# Patient Record
Sex: Male | Born: 1975 | Race: White | Hispanic: No | Marital: Married | Smoking: Never smoker
Health system: Southern US, Community
[De-identification: ages and names within clinical notes are randomized; demographics above are authoritative.]

## PROBLEM LIST (undated history)

## (undated) DIAGNOSIS — E079 Disorder of thyroid, unspecified: Secondary | ICD-10-CM

## (undated) HISTORY — DX: Disorder of thyroid, unspecified: E07.9

---

## 2009-08-05 LAB — BASIC METABOLIC PANEL: Creatinine: 1 mg/dL (ref <=1.3)

## 2011-08-02 LAB — HEPATIC FUNCTION PANEL
ALT: 62 U/L — AB (ref 10–40)
AST: 29 U/L (ref 14–40)

## 2012-08-24 ENCOUNTER — Ambulatory Visit (INDEPENDENT_AMBULATORY_CARE_PROVIDER_SITE_OTHER): Payer: 59 | Admitting: Family Medicine

## 2012-08-24 ENCOUNTER — Encounter: Payer: Self-pay | Admitting: Family Medicine

## 2012-08-24 VITALS — BP 109/72 | HR 85 | Ht 72.0 in | Wt 173.0 lb

## 2012-08-24 DIAGNOSIS — F988 Other specified behavioral and emotional disorders with onset usually occurring in childhood and adolescence: Secondary | ICD-10-CM

## 2012-08-24 DIAGNOSIS — E039 Hypothyroidism, unspecified: Secondary | ICD-10-CM

## 2012-08-24 MED ORDER — AMPHETAMINE-DEXTROAMPHET ER 30 MG PO CP24: 30.0000 mg | ORAL_CAPSULE | ORAL | Status: DC

## 2012-08-24 NOTE — Progress Notes (Signed)
CC: Wayne Newman is a 37 y.o. male is here for Establish Care   Subjective: HPI:  Pleasant 37 year old here to establish care moved from [facility]  Patient reports history of ADD for the majority of his adulthood for the past year he has been on Adderall 20 mg twice a day he does take an occasional break if he is not working or if he has an uneventful weekend he will take a "prescription holiday". He's noticed improvement with the Adderall with former problems of concentration and task completion at work and at home. He does complain that over the past 2 months he's noticed a second dose of the day does not seem as effective as it has been in the past. 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 Denies unintentional weight loss or gain, constipation or diarrhea nor tremor.  Review of Systems - General ROS: negative for - chills, fever, night sweats, weight gain or weight loss Ophthalmic ROS: negative for - decreased vision Psychological ROS: negative for - anxiety or depression ENT ROS: negative for - hearing change, nasal congestion, tinnitus or allergies Hematological and Lymphatic ROS: negative for - bleeding problems, bruising or swollen lymph nodes Breast ROS: negative Respiratory ROS: no cough, shortness of breath, or wheezing Cardiovascular ROS: no chest pain or dyspnea on exertion Gastrointestinal ROS: no abdominal pain, change in bowel habits, or black or bloody stools Genito-Urinary ROS: negative for - genital discharge, genital ulcers, incontinence or abnormal bleeding from genitals Musculoskeletal ROS: negative for - joint pain or muscle pain Neurological ROS:  negative for - headaches or memory loss Dermatological ROS: negative for lumps, mole changes, rash and skin lesion changes  Past Medical History  Diagnosis Date  . Thyroid disease      Family History  Problem Relation Age of Onset  . Alcohol abuse      grandfather  . Heart attack      grandfather  . Stroke      grandfather     History  Substance Use Topics  . Smoking status: Never Smoker   . Smokeless tobacco: Not on file  . Alcohol Use: Yes     Objective: Filed Vitals:   08/24/12 0846  BP: 109/72  Pulse: 85    General: Alert and Oriented, No Acute Distress HEENT: Pupils equal, round, reactive to light. Conjunctivae clear.  Moist mucous membranes pharynx unremarkable Lungs: Clear to auscultation bilaterally, no wheezing/ronchi/rales.  Comfortable work of breathing. Good air movement. Cardiac: Regular rate and rhythm. Normal S1/S2.  No murmurs, rubs, nor gallops.   Abdomen: Flat soft nontender Extremities: No peripheral edema.  Strong peripheral pulses.  Mental Status: No depression, anxiety, nor agitation. Skin: Warm and dry.  Assessment & Plan: Wayne Newman was seen today for establish care.  Diagnoses and associated orders for this visit:  Hypothyroid - TSH  ADD (attention deficit disorder) - amphetamine-dextroamphetamine (ADDERALL XR) 30 MG 24 hr capsule; Take 1 capsule (30 mg total) by mouth every morning.    Hypothyroidism: Clinically controlled he is due for repeat TSH will provide Synthroid refill based on results ADD: Uncontrolled chronic condition, I've encouraged to start an extended release version of Adderall in hopes of benefits lasting longer in the day compared to immediate  release twice a day   Return in about 4 weeks (around 09/21/2012).

## 2012-08-25 ENCOUNTER — Telehealth: Payer: Self-pay | Admitting: Family Medicine

## 2012-08-25 DIAGNOSIS — E039 Hypothyroidism, unspecified: Secondary | ICD-10-CM

## 2012-08-25 MED ORDER — LEVOTHYROXINE SODIUM 125 MCG PO TABS: 125.0000 ug | ORAL_TABLET | Freq: Every day | ORAL | Status: DC

## 2012-08-25 NOTE — Telephone Encounter (Signed)
LMOM for pt to return call for results and instructions. Imre Vecchione, LPN  

## 2012-08-25 NOTE — Telephone Encounter (Signed)
Wayne Newman, Will you please let Wayne Newman know that his thyroid function appears to be slightly underactive therefore I've sent a [facility] [address].  New dose is , we'll need to recheck thyroid function in 3 months

## 2012-08-25 NOTE — Telephone Encounter (Signed)
Pt notified of results

## 2012-08-28 ENCOUNTER — Encounter: Payer: Self-pay | Admitting: Family Medicine

## 2012-08-31 ENCOUNTER — Encounter: Payer: Self-pay | Admitting: *Deleted

## 2012-09-29 ENCOUNTER — Ambulatory Visit (INDEPENDENT_AMBULATORY_CARE_PROVIDER_SITE_OTHER): Payer: 59 | Admitting: Family Medicine

## 2012-09-29 ENCOUNTER — Encounter: Payer: Self-pay | Admitting: Family Medicine

## 2012-09-29 VITALS — BP 109/73 | HR 79 | Wt 173.0 lb

## 2012-09-29 DIAGNOSIS — Z13220 Encounter for screening for lipoid disorders: Secondary | ICD-10-CM

## 2012-09-29 DIAGNOSIS — Z23 Encounter for immunization: Secondary | ICD-10-CM

## 2012-09-29 DIAGNOSIS — F988 Other specified behavioral and emotional disorders with onset usually occurring in childhood and adolescence: Secondary | ICD-10-CM

## 2012-09-29 DIAGNOSIS — E039 Hypothyroidism, unspecified: Secondary | ICD-10-CM

## 2012-09-29 DIAGNOSIS — R7989 Other specified abnormal findings of blood chemistry: Secondary | ICD-10-CM

## 2012-09-29 LAB — LIPID PANEL
Cholesterol: 171 mg/dL (ref 0–200)
HDL: 39 mg/dL — ABNORMAL LOW (ref >39)
Total CHOL/HDL Ratio: 4.4 Ratio
VLDL: 19 mg/dL (ref 0–40)

## 2012-09-29 LAB — COMPLETE METABOLIC PANEL WITH GFR
AST: 34 U/L (ref 0–37)
Alkaline Phosphatase: 61 U/L (ref 39–117)
BUN: 24 mg/dL — ABNORMAL HIGH (ref 6–23)
GFR, Est Non African American: 87 mL/min
Glucose, Bld: 96 mg/dL (ref 70–99)
Potassium: 4.5 mEq/L (ref 3.5–5.3)
Sodium: 139 mEq/L (ref 135–145)
Total Bilirubin: 1.1 mg/dL (ref 0.3–1.2)
Total Protein: 7 g/dL (ref 6.0–8.3)

## 2012-09-29 MED ORDER — AMPHETAMINE-DEXTROAMPHET ER 20 MG PO CP24: 40.0000 mg | ORAL_CAPSULE | ORAL | Status: DC

## 2012-09-29 NOTE — Progress Notes (Signed)
CC: Wayne Newman is a 37 y.o. male is here for f/u ADHD and Labs Only   Subjective: HPI:  Followup ADD: Patient was switched from Adderall 20 mg twice a day now on 30 mg XR daily he does not notice much of an improvement with concentration and task completion, he thinks that this new regimen is worse than what he was on a month ago. Denies anxiety, depression, mental disturbance, sleep disturbance, appetite suppression, chest pain or irregular heartbeat  History of elevated LFTs one year ago he has not had this checked since then denies right upper quadrant pain, skin or scleral discoloration, fevers, chills, heavy alcohol use, nor abdominal pain.  It is been well over a year since he had cholesterol and fasting blood sugar checked  Review Of Systems Outlined In HPI  Past Medical History  Diagnosis Date  . Thyroid disease      Family History  Problem Relation Age of Onset  . Alcohol abuse      grandfather  . Heart attack      grandfather  . Stroke      grandfather     History  Substance Use Topics  . Smoking status: Never Smoker   . Smokeless tobacco: Not on file  . Alcohol Use: Yes     Objective: Filed Vitals:   09/29/12 0823  BP: 109/73  Pulse: 79    General: Alert and Oriented, No Acute Distress HEENT: Pupils equal, round, reactive to light. Conjunctivae clear.  Moist mucous membranes pharynx unremarkable Lungs: Clear to auscultation bilaterally, no wheezing/ronchi/rales.  Comfortable work of breathing. Good air movement. Cardiac: Regular rate and rhythm. Normal S1/S2.  No murmurs, rubs, nor gallops.   Abdomen: Soft nontender Extremities: No peripheral edema.  Strong peripheral pulses.  Mental Status: No depression, anxiety, nor agitation. Skin: Warm and dry.  Assessment & Plan: Wayne Newman was seen today for f/u adhd and labs only.  Diagnoses and associated orders for this visit:  Elevated LFTs - COMPLETE METABOLIC PANEL WITH GFR  Lipid screening - Lipid  panel  ADD (attention deficit disorder) - amphetamine-dextroamphetamine (ADDERALL XR) 20 MG 24 hr capsule; Take 2 capsules (40 mg total) by mouth every morning.  Hypothyroid  Need for prophylactic vaccination and inoculation against influenza - Flu Vaccine QUAD 36+ mos IM    Elevated LFTs: Recheck today Due for lipid screening he is fasting today ADD: Chronic uncontrolled condition increasing Adderall XR, followup 4 weeks by phone if improving return to clinic if still uncontrolled Hypothyroidism: Currently stable we'll recheck in 2 months  Return in about 4 weeks (around 10/27/2012).

## 2012-10-02 ENCOUNTER — Telehealth: Payer: Self-pay | Admitting: Family Medicine

## 2012-10-02 DIAGNOSIS — R7989 Other specified abnormal findings of blood chemistry: Secondary | ICD-10-CM

## 2012-10-02 NOTE — Telephone Encounter (Signed)
Left message on vm with results and to call back if he has not heard back in about a week for liver u/s

## 2012-10-02 NOTE — Telephone Encounter (Signed)
Sue Lush, Will you please let Wayne Newman know that his LDL cholesterol is technically elevated at 113 but well below his personal goal of less than 160 since he has no cardiac risk factors. No need to consider cholesterol lowering medications at this time.  His liver enzyme remains elevated and I'll recommend that he have an ultrasound of his liver for further evaluation, I've placed an order for this and he should be contacted about scheduling this.  Fasting blood sugar and kidney function were perfect.

## 2012-10-17 ENCOUNTER — Telehealth: Payer: Self-pay | Admitting: *Deleted

## 2012-10-17 DIAGNOSIS — E039 Hypothyroidism, unspecified: Secondary | ICD-10-CM

## 2012-10-17 MED ORDER — AMPHETAMINE-DEXTROAMPHETAMINE 20 MG PO TABS: 20.0000 mg | ORAL_TABLET | Freq: Two times a day (BID) | ORAL | Status: DC

## 2012-10-17 MED ORDER — LEVOTHYROXINE SODIUM 125 MCG PO TABS: 125.0000 ug | ORAL_TABLET | Freq: Every day | ORAL | Status: DC

## 2012-10-17 NOTE — Telephone Encounter (Signed)
Taking synthroid twice a day is something I've never seen before but if that's been his old regimen we can certainly continue it, since his TSH was elevated in the summer I'd still encourage using the tablet but now taken twice a day. This and adderall rx in the inbox.  Sorry, I must have missed this twice a day dosing when looking at his old records.

## 2012-10-17 NOTE — Telephone Encounter (Addendum)
Pt called and states he was wanting to make sure of his Synthroid dose. He states he was taking Synthroid 112 BID. The new medication sent is 125 mcg qd. Also pt states the adderall XR is too expensive and he didn't get filled. He wants to go back to the reg adderall

## 2012-10-18 ENCOUNTER — Telehealth: Payer: Self-pay | Admitting: *Deleted

## 2012-10-18 NOTE — Telephone Encounter (Signed)
Pt.notified

## 2012-10-18 NOTE — Telephone Encounter (Signed)
Pt has been notified.

## 2012-10-18 NOTE — Telephone Encounter (Signed)
Since his thyroid function was underactive when we checked it in August I'd be interested to see how his concentration responds to the increased synthroid dose twice a day along with the former adderall 20mg  twice a day dose for a month before increasing the adderall twice a day dosing.

## 2012-10-18 NOTE — Telephone Encounter (Signed)
Pt states he thought that you were going to increase the dose. I told him you wrote the rx for 20 mg BID and he wanted to know if that dose was comparable to the XR form that he was taking.Wayne Newman.(Perhaps I am missing something because the last time the XR version was rx'ed it was for 30 mg.) I didn't see anything that states you were increasing the dose...please advise

## 2012-11-28 ENCOUNTER — Other Ambulatory Visit: Payer: Self-pay | Admitting: *Deleted

## 2012-11-28 DIAGNOSIS — E039 Hypothyroidism, unspecified: Secondary | ICD-10-CM

## 2012-11-28 MED ORDER — LEVOTHYROXINE SODIUM 125 MCG PO TABS: 125.0000 ug | ORAL_TABLET | Freq: Two times a day (BID) | ORAL | Status: DC

## 2012-12-05 ENCOUNTER — Ambulatory Visit: Payer: 59 | Admitting: Family Medicine

## 2012-12-06 ENCOUNTER — Other Ambulatory Visit: Payer: Self-pay | Admitting: *Deleted

## 2012-12-06 MED ORDER — AMPHETAMINE-DEXTROAMPHETAMINE 20 MG PO TABS: 20.0000 mg | ORAL_TABLET | Freq: Two times a day (BID) | ORAL | Status: DC

## 2012-12-19 ENCOUNTER — Ambulatory Visit (INDEPENDENT_AMBULATORY_CARE_PROVIDER_SITE_OTHER): Payer: 59 | Admitting: Family Medicine

## 2012-12-19 ENCOUNTER — Encounter: Payer: Self-pay | Admitting: Family Medicine

## 2012-12-19 VITALS — BP 104/70 | HR 82 | Wt 178.0 lb

## 2012-12-19 DIAGNOSIS — F988 Other specified behavioral and emotional disorders with onset usually occurring in childhood and adolescence: Secondary | ICD-10-CM

## 2012-12-19 DIAGNOSIS — E039 Hypothyroidism, unspecified: Secondary | ICD-10-CM

## 2012-12-19 DIAGNOSIS — R7989 Other specified abnormal findings of blood chemistry: Secondary | ICD-10-CM

## 2012-12-19 DIAGNOSIS — R5383 Other fatigue: Secondary | ICD-10-CM

## 2012-12-19 DIAGNOSIS — R5381 Other malaise: Secondary | ICD-10-CM

## 2012-12-19 LAB — COMPLETE METABOLIC PANEL WITH GFR
Albumin: 4.4 g/dL (ref 3.5–5.2)
BUN: 13 mg/dL (ref 6–23)
CO2: 31 mEq/L (ref 19–32)
Calcium: 9.9 mg/dL (ref 8.4–10.5)
Chloride: 104 mEq/L (ref 96–112)
GFR, Est African American: >89 mL/min
GFR, Est Non African American: >89 mL/min
Glucose, Bld: 79 mg/dL (ref 70–99)
Potassium: 4.4 mEq/L (ref 3.5–5.3)
Sodium: 141 mEq/L (ref 135–145)
Total Protein: 6.8 g/dL (ref 6.0–8.3)

## 2012-12-19 MED ORDER — AMPHETAMINE-DEXTROAMPHETAMINE 20 MG PO TABS: 30.0000 mg | ORAL_TABLET | Freq: Two times a day (BID) | ORAL | Status: DC

## 2012-12-19 NOTE — Progress Notes (Signed)
CC: Wayne Newman is a 37 y.o. male is here for Follow-up   Subjective: HPI:  Followup hypothyroidism: Over the past 3 months has been taken 125 mcg Synthroid on a daily basis without missed doses. Denies depression, mental disturbance, constipation skin changes but does admit to considerable fatigue of moderate severity that fluctuates from mild to moderate on a daily basis without any precipitating factor that he can find. Describes it as frequent desire to take a nap during the day, nonrestorative sleep, and overall just lack of energy.  This is been going on at least for one month on a daily basis. He present any time of day. Denies falling asleep behind the wheel. Wife says he occasionally snores but this is not consistent.  Followup ADD: Has been taking Adderall 20 mg twice a day for the past 3 months. Despite trying to optimize his thyroid supplementation he reports his to his difficulty at work and at home with task completion and concentration. Reports this is interfering his life to mild to moderate degree a daily basis.  His history of elevated LFTs with rare alcohol use and no other hepatotoxic medications. We ordered a hepatic ultrasound back in September but he believes he never got a voice message about calling back to schedule. Denies skin or scleral discoloration nor right upper quadrant pain  Review Of Systems Outlined In HPI  Past Medical History  Diagnosis Date  . Thyroid disease      Family History  Problem Relation Age of Onset  . Alcohol abuse      grandfather  . Heart attack      grandfather  . Stroke      grandfather     History  Substance Use Topics  . Smoking status: Never Smoker   . Smokeless tobacco: Not on file  . Alcohol Use: Yes     Objective: Filed Vitals:   12/19/12 0816  BP: 104/70  Pulse: 82    General: Alert and Oriented, No Acute Distress HEENT: Pupils equal, round, reactive to light. Conjunctivae clear.  Moist mucous membranes pharynx  unremarkable Lungs: Clear to auscultation bilaterally, no wheezing/ronchi/rales.  Comfortable work of breathing. Good air movement. Cardiac: Regular rate and rhythm. Normal S1/S2.  No murmurs, rubs, nor gallops.   Extremities: No peripheral edema.  Strong peripheral pulses.  Mental Status: No depression, anxiety, nor agitation. Skin: Warm and dry.  Assessment & Plan: Wayne Newman was seen today for follow-up.  Diagnoses and associated orders for this visit:  Hypothyroid - TSH  Elevated LFTs - COMPLETE METABOLIC PANEL WITH GFR  ADD (attention deficit disorder) - amphetamine-dextroamphetamine (ADDERALL) 20 MG tablet; Take 1.5 tablets (30 mg total) by mouth 2 (two) times daily.  Fatigue - COMPLETE METABOLIC PANEL WITH GFR - Vit D  25 hydroxy (rtn osteoporosis monitoring) - B12    Hypothyroidism: Clinically uncontrolled rechecking TSH today Elevated LFTs repeat metabolic panel for LFTs before resubmitting request for abdominal ultrasound ADD: Uncontrolled increasing Adderall Fatigue: Metabolic panel with vitamin studies above, could consider sleep test if all labs above are unremarkable  Return if symptoms worsen or fail to improve.

## 2012-12-20 ENCOUNTER — Encounter: Payer: Self-pay | Admitting: Family Medicine

## 2012-12-20 DIAGNOSIS — R5383 Other fatigue: Secondary | ICD-10-CM | POA: Insufficient documentation

## 2012-12-20 LAB — VITAMIN D 25 HYDROXY (VIT D DEFICIENCY, FRACTURES): Vit D, 25-Hydroxy: 33 ng/mL (ref 30–89)

## 2013-02-15 ENCOUNTER — Other Ambulatory Visit: Payer: Self-pay | Admitting: *Deleted

## 2013-02-15 DIAGNOSIS — F988 Other specified behavioral and emotional disorders with onset usually occurring in childhood and adolescence: Secondary | ICD-10-CM

## 2013-02-15 MED ORDER — AMPHETAMINE-DEXTROAMPHETAMINE 20 MG PO TABS: 30.0000 mg | ORAL_TABLET | Freq: Two times a day (BID) | ORAL | Status: DC

## 2013-02-20 ENCOUNTER — Telehealth: Payer: Self-pay | Admitting: Family Medicine

## 2013-02-20 DIAGNOSIS — G4730 Sleep apnea, unspecified: Secondary | ICD-10-CM | POA: Insufficient documentation

## 2013-02-20 MED ORDER — AMBULATORY NON FORMULARY MEDICATION: Status: DC

## 2013-02-20 NOTE — Telephone Encounter (Signed)
lvm informing pt of results/recommendations.Loralee PacasBarkley, Trasean Delima ViennaLynetta

## 2013-02-20 NOTE — Telephone Encounter (Signed)
Faxed order,sleep study,insurance card,demographics to [facility] at [phone]

## 2013-02-20 NOTE — Telephone Encounter (Signed)
Pt does not have out of network benefits and [facility] is out of network per rep at [facility]. Will try a home health agency

## 2013-02-20 NOTE — Telephone Encounter (Signed)
Sue LushAndrea, Will you please let mr. Raquel Jamesittman know that his sleep study revealed mild sleep apea.  This can certainly contribute to fatigue.  I would recommend he try using CPAP for a few weeks.  Usually this can be arranged through his insurance.  I'll print off an order for this if he's interested, can you please see if [facility] can help.  I'll put the [employer] report in your box in case [facility] needs a copy, can you please scan it once you're done.

## 2013-02-21 NOTE — Telephone Encounter (Signed)
Aram BeechamCynthia from [facility] called and said they have received the order and now they need the progress notes. Progress notes faxed to [phone]

## 2013-04-04 ENCOUNTER — Telehealth: Payer: Self-pay | Admitting: *Deleted

## 2013-04-04 DIAGNOSIS — F988 Other specified behavioral and emotional disorders with onset usually occurring in childhood and adolescence: Secondary | ICD-10-CM

## 2013-04-04 MED ORDER — AMPHETAMINE-DEXTROAMPHETAMINE 20 MG PO TABS: 30.0000 mg | ORAL_TABLET | Freq: Two times a day (BID) | ORAL | Status: DC

## 2013-04-04 NOTE — Telephone Encounter (Signed)
Pt calls and request a refill on his Adderall 20mg  and wants a 90 day supply.  Are you ok with this?

## 2013-04-04 NOTE — Telephone Encounter (Signed)
Wayne Newman (and Manuella GhaziKim FYI), Will you please let patient know that since this is a controlled substance I only write it one month at a time.  He's due for follow up and if we're not needing to change the dose at that visit I can provide post-dated Rxs to last three months.  1 month Rx printed and placed in Andrea's inbox.

## 2013-04-04 NOTE — Telephone Encounter (Signed)
Pt notified to pick up rx. Barry DienesKimberly Gordon, LPN

## 2013-04-04 NOTE — Telephone Encounter (Signed)
rx up front 

## 2013-05-18 ENCOUNTER — Ambulatory Visit (INDEPENDENT_AMBULATORY_CARE_PROVIDER_SITE_OTHER): Payer: 59 | Admitting: Family Medicine

## 2013-05-18 ENCOUNTER — Encounter: Payer: Self-pay | Admitting: Family Medicine

## 2013-05-18 ENCOUNTER — Telehealth: Payer: Self-pay | Admitting: Family Medicine

## 2013-05-18 VITALS — BP 104/63 | HR 90 | Ht 72.0 in | Wt 169.0 lb

## 2013-05-18 DIAGNOSIS — F988 Other specified behavioral and emotional disorders with onset usually occurring in childhood and adolescence: Secondary | ICD-10-CM

## 2013-05-18 DIAGNOSIS — G4730 Sleep apnea, unspecified: Secondary | ICD-10-CM

## 2013-05-18 DIAGNOSIS — E039 Hypothyroidism, unspecified: Secondary | ICD-10-CM

## 2013-05-18 LAB — TSH: TSH: 0.206 u[IU]/mL — ABNORMAL LOW (ref 0.350–4.500)

## 2013-05-18 MED ORDER — AMPHETAMINE-DEXTROAMPHETAMINE 20 MG PO TABS
30.0000 mg | ORAL_TABLET | Freq: Two times a day (BID) | ORAL | Status: DC
Start: 2013-05-18 — End: 2013-10-25

## 2013-05-18 MED ORDER — AMPHETAMINE-DEXTROAMPHETAMINE 20 MG PO TABS: 30.0000 mg | ORAL_TABLET | Freq: Two times a day (BID) | ORAL | Status: DC

## 2013-05-18 NOTE — Progress Notes (Signed)
CC: Wayne BoastRyan Newman is a 38 y.o. male is here for Follow-up   Subjective: HPI:  Followup hypothyroidism:  Continues on Synthroid 250 mcg every evening. Denies missed doses. Review of systems positive for fatigue. Denies unintentional weight loss or gain, anxiety, depression, mental disturbance. Denies skin or hair changes.  Followup ADD: States that attention and concentration remains satisfactory on 30 mg of Adderall twice a day. Denies and side effects. His appetite suppression or sleep disturbance.  States the fatigue has not changed since I saw him last. It is present on a daily basis mild/moderate in severity present all hours of the day. He does have a history of falling asleep behind the wheel but not since I saw him last.  There's been no shortness of breath or weakness   Review Of Systems Outlined In HPI  Past Medical History  Diagnosis Date  . Thyroid disease     No past surgical history on file. Family History  Problem Relation Age of Onset  . Alcohol abuse      grandfather  . Heart attack      grandfather  . Stroke      grandfather    History   Social History  . Marital Status: Married    Spouse Name: N/A    Number of Children: N/A  . Years of Education: N/A   Occupational History  . Not on file.   Social History Main Topics  . Smoking status: Never Smoker   . Smokeless tobacco: Not on file  . Alcohol Use: Yes  . Drug Use: No  . Sexual Activity: Yes    Birth Control/ Protection: Condom   Other Topics Concern  . Not on file   Social History Narrative  . No narrative on file     Objective: BP 104/63  Pulse 90  Ht 6' (1.829 m)  Wt 169 lb (76.658 kg)  BMI 22.92 kg/m2  General: Alert and Oriented, No Acute Distress HEENT: Pupils equal, round, reactive to light. Conjunctivae clear.  Moist mucous membranes pharynx unremarkable. Neck supple without palpable thyromegaly Lungs: Clear to auscultation bilaterally, no wheezing/ronchi/rales.  Comfortable  work of breathing. Good air movement. Cardiac: Regular rate and rhythm. Normal S1/S2.  No murmurs, rubs, nor gallops.   Extremities: No peripheral edema.  Strong peripheral pulses.  Mental Status: No depression, anxiety, nor agitation. Skin: Warm and dry.  Assessment & Plan: Wayne Newman was seen today for follow-up.  Diagnoses and associated orders for this visit:  ADD (attention deficit disorder) - Discontinue: amphetamine-dextroamphetamine (ADDERALL) 20 MG tablet; Take 1.5 tablets (30 mg total) by mouth 2 (two) times daily. - Discontinue: amphetamine-dextroamphetamine (ADDERALL) 20 MG tablet; Take 1.5 tablets (30 mg total) by mouth 2 (two) times daily. May fill on/after 06/18/13 - amphetamine-dextroamphetamine (ADDERALL) 20 MG tablet; Take 1.5 tablets (30 mg total) by mouth 2 (two) times daily. May fill on/after 07/18/13  Hypothyroid - TSH  Mild sleep apnea    ADD: Controlled continue Adderall Hypothyroidism: Suspicion that this could be uncontrolled due to fatigue therefore checking TSH Sleep apnea with fatigue: Clinically uncontrolled, he was never contacted by a respiratory company for CPAP machine per his report therefore we will try to coordinate this again. Urged to try CPAP   Return in about 3 months (around 08/18/2013).

## 2013-05-18 NOTE — Telephone Encounter (Signed)
Wayne Newman, Can you please see if [facility] can help arrange a CPAP for Wayne RossettiRyan, he states he never got called.  Please see phone note from 2/10

## 2013-05-21 ENCOUNTER — Telehealth: Payer: Self-pay | Admitting: Family Medicine

## 2013-05-21 DIAGNOSIS — E039 Hypothyroidism, unspecified: Secondary | ICD-10-CM

## 2013-05-21 MED ORDER — LEVOTHYROXINE SODIUM 112 MCG PO TABS: 224.0000 ug | ORAL_TABLET | Freq: Every day | ORAL | Status: DC

## 2013-05-21 NOTE — Telephone Encounter (Signed)
Pt.notified

## 2013-05-21 NOTE — Telephone Encounter (Signed)
Wayne Newman, Will you please let patient know that his TSH was 0.2 reflecting that his synthroid dose is too high.  I've [facility] in a [facility], I'd recommend he return for a TSH recheck in August.

## 2013-05-24 ENCOUNTER — Other Ambulatory Visit: Payer: Self-pay | Admitting: *Deleted

## 2013-05-24 MED ORDER — AMBULATORY NON FORMULARY MEDICATION: Status: DC

## 2013-05-24 NOTE — Telephone Encounter (Signed)
refaxed every thing back to [facility] with a note that pt didn't hear back from anyone

## 2013-06-10 ENCOUNTER — Other Ambulatory Visit: Payer: Self-pay | Admitting: Family Medicine

## 2013-08-31 ENCOUNTER — Encounter: Payer: Self-pay | Admitting: Family Medicine

## 2013-08-31 ENCOUNTER — Ambulatory Visit (INDEPENDENT_AMBULATORY_CARE_PROVIDER_SITE_OTHER): Payer: Managed Care, Other (non HMO) | Admitting: Family Medicine

## 2013-08-31 VITALS — Ht 72.0 in | Wt 177.0 lb

## 2013-08-31 DIAGNOSIS — L301 Dyshidrosis [pompholyx]: Secondary | ICD-10-CM

## 2013-08-31 DIAGNOSIS — L237 Allergic contact dermatitis due to plants, except food: Secondary | ICD-10-CM

## 2013-08-31 DIAGNOSIS — L255 Unspecified contact dermatitis due to plants, except food: Secondary | ICD-10-CM

## 2013-08-31 DIAGNOSIS — F988 Other specified behavioral and emotional disorders with onset usually occurring in childhood and adolescence: Secondary | ICD-10-CM

## 2013-08-31 MED ORDER — TRIAMCINOLONE ACETONIDE 0.1 % EX CREA
TOPICAL_CREAM | CUTANEOUS | Status: DC
Start: 2013-08-31 — End: 2014-01-08

## 2013-08-31 MED ORDER — PREDNISONE 20 MG PO TABS: ORAL_TABLET | ORAL | Status: AC

## 2013-08-31 NOTE — Progress Notes (Signed)
CC: Wayne Newman is a 38 y.o. male is here for Rash   Subjective: HPI:  Complains of a rash localized on the forearms and back that has been present for the past 3-4 days seems to be spreading, intensely itchy, painless, occurred a few days after known exposure to what he believes was poison ivy. He's had this once before as a child but nothing recently. No interventions as of yet other than washing his body with soap. Denies ocular complaints, shortness of breath, fevers, chills, swollen lymph nodes.  He also points out a rash on the dorsum of his feet have been present for the past months. It usually only occurs in the summer. In the winter will go away without any particular intervention. It is moderately itchy he's been told is only dry skin and was told to just keep the area moist which doesn't seem to help much.  Followup ADD: Has a Adderall prescription that was dated for July that he did not need to fill because he's only taking the medication on days that he has to work and he said a lot of time off this summer. He states that the medication is still helping with concentration, and difficulty with multitasking and ignoring distractions. Denies known intolerance. He wants to know if it's okay to have the prescription filled he been no is a month behind.   Review Of Systems Outlined In HPI  Past Medical History  Diagnosis Date  . Thyroid disease     No past surgical history on file. Family History  Problem Relation Age of Onset  . Alcohol abuse      grandfather  . Heart attack      grandfather  . Stroke      grandfather    History   Social History  . Marital Status: Married    Spouse Name: N/A    Number of Children: N/A  . Years of Education: N/A   Occupational History  . Not on file.   Social History Main Topics  . Smoking status: Never Smoker   . Smokeless tobacco: Not on file  . Alcohol Use: Yes  . Drug Use: No  . Sexual Activity: Yes    Birth Control/  Protection: Condom   Other Topics Concern  . Not on file   Social History Narrative  . No narrative on file     Objective: Ht 6' (1.829 m)  Wt 177 lb (80.287 kg)  BMI 24.00 kg/m2  General: Alert and Oriented, No Acute Distress HEENT: Pupils equal, round, reactive to light. Conjunctivae clear.  Moist mucous membranes there Lungs: Clear to auscultation bilaterally, no wheezing/ronchi/rales.  Comfortable work of breathing. Good air movement. Cardiac: Regular rate and rhythm. Normal S1/S2.  No murmurs, rubs, nor gallops.   Extremities: No peripheral edema.  Strong peripheral pulses.  Mental Status: No depression, anxiety, nor agitation. Skin: Warm and dry. Streaks of fluid filled vesicles on the forearms, lower back which are nontender with mild erythema. Eczematous changes on the dorsum of his left foot greater than right.  Assessment & Plan: Zelig was seen today for rash.  Diagnoses and associated orders for this visit:  Poison ivy dermatitis - predniSONE (DELTASONE) 20 MG tablet; Three tabs daily days 1-3, two tabs daily days 4-6, one tab daily days 7-9, half tab daily days 10-13. - triamcinolone cream (KENALOG) 0.1 %; Apply to affected areas twice a day for up to two weeks, avoid face.  Dyshidrotic eczema - predniSONE (DELTASONE) 20  MG tablet; Three tabs daily days 1-3, two tabs daily days 4-6, one tab daily days 7-9, half tab daily days 10-13. - triamcinolone cream (KENALOG) 0.1 %; Apply to affected areas twice a day for up to two weeks, avoid face.  ADD (attention deficit disorder)    Poison ivy dermatitis: Start prednisone taper and triamcinolone cream Dyshidrotic eczema: Prednisone and triamcinolone cream Will also greatly help with this, I think urgent use triamcinolone cream as needed in the future but to use Eucerin on the feet to help prevent recurrence ADD: Controlled discussed I think there should be no problem at the pharmacy if he tries to fill the prescription  dated for July, call when refills are needed no need to followup until November or December   Return in about 3 months (around 12/01/2013).

## 2013-10-25 ENCOUNTER — Other Ambulatory Visit: Payer: Self-pay

## 2013-10-25 DIAGNOSIS — F988 Other specified behavioral and emotional disorders with onset usually occurring in childhood and adolescence: Secondary | ICD-10-CM

## 2013-10-25 MED ORDER — AMPHETAMINE-DEXTROAMPHETAMINE 20 MG PO TABS: 30.0000 mg | ORAL_TABLET | Freq: Two times a day (BID) | ORAL | Status: DC

## 2013-10-26 ENCOUNTER — Other Ambulatory Visit: Payer: Self-pay

## 2013-10-26 DIAGNOSIS — F988 Other specified behavioral and emotional disorders with onset usually occurring in childhood and adolescence: Secondary | ICD-10-CM

## 2013-10-26 MED ORDER — AMPHETAMINE-DEXTROAMPHETAMINE 20 MG PO TABS: 30.0000 mg | ORAL_TABLET | Freq: Two times a day (BID) | ORAL | Status: DC

## 2013-11-28 ENCOUNTER — Telehealth: Payer: Self-pay

## 2013-11-28 DIAGNOSIS — Z3009 Encounter for other general counseling and advice on contraception: Secondary | ICD-10-CM

## 2013-11-28 NOTE — Telephone Encounter (Signed)
Referral placed.

## 2013-11-28 NOTE — Telephone Encounter (Signed)
Wayne Newman called and left a message stating her would [facility]. Please advise.

## 2013-12-04 ENCOUNTER — Other Ambulatory Visit: Payer: Self-pay

## 2013-12-04 DIAGNOSIS — F988 Other specified behavioral and emotional disorders with onset usually occurring in childhood and adolescence: Secondary | ICD-10-CM

## 2013-12-04 MED ORDER — AMPHETAMINE-DEXTROAMPHETAMINE 20 MG PO TABS: 30.0000 mg | ORAL_TABLET | Freq: Two times a day (BID) | ORAL | Status: DC

## 2013-12-04 NOTE — Telephone Encounter (Signed)
Patient will pick up tomorrow.

## 2013-12-05 ENCOUNTER — Other Ambulatory Visit: Payer: Self-pay | Admitting: Family Medicine

## 2013-12-28 ENCOUNTER — Ambulatory Visit: Payer: Managed Care, Other (non HMO) | Admitting: Family Medicine

## 2013-12-28 DIAGNOSIS — Z0289 Encounter for other administrative examinations: Secondary | ICD-10-CM

## 2014-01-08 ENCOUNTER — Encounter: Payer: Self-pay | Admitting: Family Medicine

## 2014-01-08 ENCOUNTER — Ambulatory Visit (INDEPENDENT_AMBULATORY_CARE_PROVIDER_SITE_OTHER): Payer: Managed Care, Other (non HMO) | Admitting: Family Medicine

## 2014-01-08 VITALS — BP 118/76 | HR 74 | Wt 187.0 lb

## 2014-01-08 DIAGNOSIS — F909 Attention-deficit hyperactivity disorder, unspecified type: Secondary | ICD-10-CM

## 2014-01-08 DIAGNOSIS — R5382 Chronic fatigue, unspecified: Secondary | ICD-10-CM

## 2014-01-08 DIAGNOSIS — F988 Other specified behavioral and emotional disorders with onset usually occurring in childhood and adolescence: Secondary | ICD-10-CM

## 2014-01-08 DIAGNOSIS — L301 Dyshidrosis [pompholyx]: Secondary | ICD-10-CM

## 2014-01-08 DIAGNOSIS — E038 Other specified hypothyroidism: Secondary | ICD-10-CM

## 2014-01-08 DIAGNOSIS — B001 Herpesviral vesicular dermatitis: Secondary | ICD-10-CM

## 2014-01-08 MED ORDER — CLOBETASOL PROPIONATE 0.05 % EX CREA: 1.0000 "application " | TOPICAL_CREAM | Freq: Two times a day (BID) | CUTANEOUS | Status: DC

## 2014-01-08 MED ORDER — VALACYCLOVIR HCL 1 G PO TABS: 1000.0000 mg | ORAL_TABLET | Freq: Two times a day (BID) | ORAL | Status: DC

## 2014-01-08 MED ORDER — AMPHETAMINE-DEXTROAMPHETAMINE 20 MG PO TABS: 30.0000 mg | ORAL_TABLET | Freq: Two times a day (BID) | ORAL | Status: DC

## 2014-01-08 NOTE — Progress Notes (Signed)
CC: Wayne BoastRyan Newman is a 38 y.o. male is here for ADHD f/u and f/u thyroid   Subjective: HPI:  Follow-up hyperthyroidism: Continues to take 2 tablets of 112 g Synthroid on a daily basis. He reports fatigue that is of mild-to-moderate severity on a daily basis. This was present prior to the recent birth of their newborn and has been worsened since he's had more responsibilities at night for taking care of his new child. Reports nonrestorative sleep and the only thing that seems to help him with his fatigue is the Adderall that he takes separately for ADD. He had a positive sleep test for mild sleep apnea and never used CPAP. Reports only skin changes described below. Denies any hair changes or GI disturbance.  Follow-up ADD: Continues to take Adderall 15 mg twice a day most days of the week but typically never on the weekends. He only takes this when he has to be fully focused at work if he does not take this he often gets behind in tasks that jeopardize his job. He does not take it on the weekends because he and his wife do not mind fullness or distractibility and comes to household chores and hobbies.  He has a shallow ulceration on his left lower lip. He tells me he gets these frequently throughout the year. It seems to be precipitated when he drinks or eats high sodium foods. It starts off with a mild burning and tingling and then arrives into a red ulceration that is painful to touch. He denies any of these lesions ever occurring elsewhere on his body and his wife has never developed these either. He's had this ever since he was in college. No interventions as of yet.   complains of continued rash on the top of his feet. Again he describes it as worse in the summer when his feet get sweaty and perspire. It slightly gets improved during the winter but is always present. It is itchy and worse with scratching. He is uncertain whether or not the triamcinolone really made any difference or if it improved  while he was on prednisone for poison ivy. He denies any skin changes elsewhere     Review Of Systems Outlined In HPI  Past Medical History  Diagnosis Date  . Thyroid disease     No past surgical history on file. Family History  Problem Relation Age of Onset  . Alcohol abuse      grandfather  . Heart attack      grandfather  . Stroke      grandfather    History   Social History  . Marital Status: Married    Spouse Name: N/A    Number of Children: N/A  . Years of Education: N/A   Occupational History  . Not on file.   Social History Main Topics  . Smoking status: Never Smoker   . Smokeless tobacco: Not on file  . Alcohol Use: Yes  . Drug Use: No  . Sexual Activity: Yes    Birth Control/ Protection: Condom   Other Topics Concern  . Not on file   Social History Narrative     Objective: BP 118/76 mmHg  Pulse 74  Wt 187 lb (84.823 kg)  General: Alert and Oriented, No Acute Distress HEENT: Pupils equal, round, reactive to light. Conjunctivae clear.Moist mucous membranes pharynx unremarkable. On the left lower lateral lip he has a 3 mm diameter shallow ulceration with overlying scabs. Lungs: Clear comfortable work of breathing .  Cardiac: Regular rate and rhythm.  Extremities: No peripheral edema.  Strong peripheral pulses.  Mental Status: No depression, anxiety, nor agitation. Skin: Warm and dry. Mild to moderate eczematous changes on the dorsum of both feet  Assessment & Plan: [name] was seen today for adhd f/u and f/u thyroid.  Diagnoses and associated orders for this visit:  ADD (attention deficit disorder) - amphetamine-dextroamphetamine (ADDERALL) 20 MG tablet; Take 1.5 tablets (30 mg total) by mouth 2 (two) times daily.  Other specified hypothyroidism - TSH  Chronic fatigue - Vit D  25 hydroxy (rtn osteoporosis monitoring) - Vitamin B12  Fever blister - valACYclovir (VALTREX) 1000 MG tablet; Take 1 tablet (1,000 mg total) by mouth 2 (two) times  daily. Begin as soon as possible when fever blister occurs.  Take no longer than five days.  Dyshidrotic eczema - clobetasol cream (TEMOVATE) 0.05 %; Apply 1 application topically 2 (two) times daily.    ADD: Controlled continue Adderall Hypothyroidism: Clinically uncontrolled but will await TSH before adjusting  brand name Synthroid.   fatigue: Rule out vitamin D or B12 deficiency Fever blister: Discussed using Valtrex on an as-needed basis to minimize formation of these blisters onset of discomfort Dyshidrotic eczema: Stop triamcinolone increasing to clobetasol.   Return if symptoms worsen or fail to improve.

## 2014-01-09 ENCOUNTER — Telehealth: Payer: Self-pay | Admitting: Family Medicine

## 2014-01-09 LAB — VITAMIN D 25 HYDROXY (VIT D DEFICIENCY, FRACTURES): Vit D, 25-Hydroxy: 30 ng/mL (ref 30–100)

## 2014-01-09 LAB — TSH: TSH: 0.358 u[IU]/mL (ref 0.350–4.500)

## 2014-01-09 LAB — VITAMIN B12: Vitamin B-12: 625 pg/mL (ref 211–911)

## 2014-01-09 MED ORDER — SYNTHROID 112 MCG PO TABS: ORAL_TABLET | ORAL | Status: DC

## 2014-01-09 MED ORDER — VITAMIN D (ERGOCALCIFEROL) 1.25 MG (50000 UNIT) PO CAPS: 50000.0000 [IU] | ORAL_CAPSULE | ORAL | Status: DC

## 2014-01-09 NOTE — Telephone Encounter (Signed)
Pt.notified

## 2014-01-09 NOTE — Telephone Encounter (Signed)
Sue Lushndrea, Will you please let patient know that his thyroid supplementation is within normal limits.  I've sent refills of the 112 mcg formulation of synthroid to his pharmacy.  Also vitamin D level is low which could be the cause of his fatigue therefore a [facility]  F/U in three months after completing the Vitamin D supplements.

## 2014-03-08 ENCOUNTER — Telehealth: Payer: Self-pay | Admitting: *Deleted

## 2014-03-08 DIAGNOSIS — F988 Other specified behavioral and emotional disorders with onset usually occurring in childhood and adolescence: Secondary | ICD-10-CM

## 2014-03-11 MED ORDER — AMPHETAMINE-DEXTROAMPHETAMINE 20 MG PO TABS: 30.0000 mg | ORAL_TABLET | Freq: Two times a day (BID) | ORAL | Status: DC

## 2014-03-11 NOTE — Telephone Encounter (Signed)
Pt notified and rx up front 

## 2014-03-11 NOTE — Telephone Encounter (Signed)
Andrea, Rx placed in in-box ready for pickup/faxing.  

## 2014-04-26 ENCOUNTER — Other Ambulatory Visit: Payer: Self-pay | Admitting: *Deleted

## 2014-04-26 DIAGNOSIS — F988 Other specified behavioral and emotional disorders with onset usually occurring in childhood and adolescence: Secondary | ICD-10-CM

## 2014-04-26 MED ORDER — AMPHETAMINE-DEXTROAMPHETAMINE 20 MG PO TABS: 30.0000 mg | ORAL_TABLET | Freq: Two times a day (BID) | ORAL | Status: DC

## 2014-06-07 ENCOUNTER — Other Ambulatory Visit: Payer: Self-pay | Admitting: *Deleted

## 2014-06-07 DIAGNOSIS — F988 Other specified behavioral and emotional disorders with onset usually occurring in childhood and adolescence: Secondary | ICD-10-CM

## 2014-06-07 MED ORDER — AMPHETAMINE-DEXTROAMPHETAMINE 20 MG PO TABS: 30.0000 mg | ORAL_TABLET | Freq: Two times a day (BID) | ORAL | Status: DC

## 2014-06-14 ENCOUNTER — Other Ambulatory Visit: Payer: Self-pay | Admitting: *Deleted

## 2014-06-14 MED ORDER — SYNTHROID 112 MCG PO TABS: ORAL_TABLET | ORAL | Status: DC

## 2014-07-19 ENCOUNTER — Other Ambulatory Visit: Payer: Self-pay | Admitting: *Deleted

## 2014-07-19 DIAGNOSIS — F988 Other specified behavioral and emotional disorders with onset usually occurring in childhood and adolescence: Secondary | ICD-10-CM

## 2014-07-19 MED ORDER — AMPHETAMINE-DEXTROAMPHETAMINE 20 MG PO TABS: 30.0000 mg | ORAL_TABLET | Freq: Two times a day (BID) | ORAL | Status: DC

## 2014-07-23 ENCOUNTER — Ambulatory Visit (INDEPENDENT_AMBULATORY_CARE_PROVIDER_SITE_OTHER): Payer: BLUE CROSS/BLUE SHIELD | Admitting: Family Medicine

## 2014-07-23 ENCOUNTER — Encounter: Payer: Self-pay | Admitting: Family Medicine

## 2014-07-23 VITALS — BP 112/72 | HR 81 | Ht 72.0 in | Wt 187.0 lb

## 2014-07-23 DIAGNOSIS — E038 Other specified hypothyroidism: Secondary | ICD-10-CM

## 2014-07-23 DIAGNOSIS — F909 Attention-deficit hyperactivity disorder, unspecified type: Secondary | ICD-10-CM | POA: Diagnosis not present

## 2014-07-23 DIAGNOSIS — M542 Cervicalgia: Secondary | ICD-10-CM

## 2014-07-23 DIAGNOSIS — R5383 Other fatigue: Secondary | ICD-10-CM | POA: Diagnosis not present

## 2014-07-23 DIAGNOSIS — F988 Other specified behavioral and emotional disorders with onset usually occurring in childhood and adolescence: Secondary | ICD-10-CM

## 2014-07-23 LAB — TSH: TSH: 0.968 u[IU]/mL (ref 0.350–4.500)

## 2014-07-23 MED ORDER — AMOXICILLIN-POT CLAVULANATE 500-125 MG PO TABS: ORAL_TABLET | ORAL | Status: AC

## 2014-07-23 NOTE — Progress Notes (Signed)
CC: Wayne Newman is a 39 y.o. male is here for Hypothyroidism and ADHD   Subjective: HPI:  Follow-up ADD: Denies any known side effects, continues to take Adderall during all workdays. He so gets benefit from concentration and reducing his distractibility. Denies chest pain, anxiety, or sleep disturbance.  Follow-up hypothyroidism: Continues to take 2 tablets of Synthroid 112 g on a daily basis. No unintentional weight gain or loss. Denies any hair or skin complaints. Denies any fatigue, irritability, abdominal pain, diarrhea nor constipation.  Follow-up fatigue: Fatigue is no longer inferior quality of life  One half months ago he was jumping in a bouncy house and landed on his rear end that caused his head to whip back quickly. Ever since then he's had a sharp mild pain at the center of the base of the back of the neck only with extreme neck extension. He has full range of motion of the neck without any other position that causes pain. He denies any overlying skin changes. No radiation of pain. No motor or sensory disturbances in the upper extremities  Complains of nasal congestion and facial pressure in the cheeks that has been present for a few days now. Worse at night when lying down on the back. No fevers, chills, cough nor sore throat. Symptoms are mildly improved with Mucinex.   Review Of Systems Outlined In HPI  Past Medical History  Diagnosis Date  . Thyroid disease     No past surgical history on file. Family History  Problem Relation Age of Onset  . Alcohol abuse      grandfather  . Heart attack      grandfather  . Stroke      grandfather    History   Social History  . Marital Status: Married    Spouse Name: N/A  . Number of Children: N/A  . Years of Education: N/A   Occupational History  . Not on file.   Social History Main Topics  . Smoking status: Never Smoker   . Smokeless tobacco: Not on file  . Alcohol Use: Yes  . Drug Use: No  . Sexual Activity:  Yes    Birth Control/ Protection: Condom   Other Topics Concern  . Not on file   Social History Narrative     Objective: BP 112/72 mmHg  Pulse 81  Ht 6' (1.829 m)  Wt 187 lb (84.823 kg)  BMI 25.36 kg/m2  General: Alert and Oriented, No Acute Distress HEENT: Pupils equal, round, reactive to light. Conjunctivae clear.  External ears unremarkable, canals clear with intact TMs with appropriate landmarks.  Middle ear appears open without effusion. Pink inferior turbinates.  Moist mucous membranes, pharynx without inflammation nor lesions other than mild cobblestoning .  Neck supple without palpable lymphadenopathy nor abnormal masses. Lungs: Clear to auscultation bilaterally, no wheezing/ronchi/rales.  Comfortable work of breathing. Good air movement. Neck: No midline spinous process tenderness in the cervical spine.  Cardiac: Regular rate and rhythm. Normal S1/S2.  No murmurs, rubs, nor gallops.   Extremities: No peripheral edema.  Strong peripheral pulses.  Mental Status: No depression, anxiety, nor agitation. Skin: Warm and dry.  Assessment & Plan: Wayne Newman was seen today for hypothyroidism and adhd.  Diagnoses and all orders for this visit:  ADD (attention deficit disorder)  Other specified hypothyroidism Orders: -     TSH  Other fatigue  Neck pain  Other orders -     amoxicillin-clavulanate (AUGMENTIN) 500-125 MG per tablet; Take one by mouth  every 8 hours for ten total days.   ADD: Control continue Adderall on workdays Hypothyroidism: Clinically controlled due for TSH, he'll need brand name Synthroid sent to his pharmacy based on this result Fatigue: Resolved Neck pain: Discussed low suspicion of fracture of the vertebral process of one of the cervical vertebrae, offered getting an x-ray however joint decision to wait until the end of the month and the pain is still present consider an x-ray Sinusitis: Discussed sinus infection is most likely viral at this time however  symptoms persist until the end of the week start amoxicillin. Continue Mucinex and consider nasal saline washes  Return in about 6 months (around 01/23/2015).

## 2014-07-24 ENCOUNTER — Telehealth: Payer: Self-pay | Admitting: Family Medicine

## 2014-07-24 MED ORDER — SYNTHROID 112 MCG PO TABS: ORAL_TABLET | ORAL | Status: DC

## 2014-07-24 NOTE — Telephone Encounter (Signed)
Sue Lushndrea, Will you please let patient know that his thyroid supplement appears to be adequate therefore [facility]. F/U in six months.

## 2014-07-24 NOTE — Telephone Encounter (Signed)
Left message on vm

## 2014-08-29 ENCOUNTER — Other Ambulatory Visit: Payer: Self-pay | Admitting: Family Medicine

## 2014-08-29 DIAGNOSIS — F988 Other specified behavioral and emotional disorders with onset usually occurring in childhood and adolescence: Secondary | ICD-10-CM

## 2014-08-29 MED ORDER — AMPHETAMINE-DEXTROAMPHETAMINE 20 MG PO TABS: 30.0000 mg | ORAL_TABLET | Freq: Two times a day (BID) | ORAL | Status: DC

## 2014-08-29 NOTE — Telephone Encounter (Signed)
Wayne Newman, Rx placed in in-box ready for pickup/faxing.  

## 2014-08-29 NOTE — Telephone Encounter (Signed)
Pt called clinic to request refill on Adderall. It is time for a refill, but Pt requested if he could get 3 dated Rx's at a time. States he used to get them written this way and it saved him from having to drive to the office monthly to pick up his Rx's. Will route to PCP for review.

## 2014-12-30 ENCOUNTER — Other Ambulatory Visit: Payer: Self-pay

## 2014-12-30 DIAGNOSIS — F988 Other specified behavioral and emotional disorders with onset usually occurring in childhood and adolescence: Secondary | ICD-10-CM

## 2014-12-30 MED ORDER — AMPHETAMINE-DEXTROAMPHETAMINE 20 MG PO TABS: 30.0000 mg | ORAL_TABLET | Freq: Two times a day (BID) | ORAL | Status: DC

## 2015-01-02 ENCOUNTER — Ambulatory Visit (INDEPENDENT_AMBULATORY_CARE_PROVIDER_SITE_OTHER): Payer: BLUE CROSS/BLUE SHIELD | Admitting: Family Medicine

## 2015-01-02 VITALS — BP 141/97 | HR 80 | Temp 97.5°F | Wt 187.0 lb

## 2015-01-02 DIAGNOSIS — R1013 Epigastric pain: Secondary | ICD-10-CM | POA: Diagnosis not present

## 2015-01-02 LAB — COMPREHENSIVE METABOLIC PANEL
ALT: 71 U/L — ABNORMAL HIGH (ref 9–46)
AST: 25 U/L (ref 10–40)
Albumin: 4.6 g/dL (ref 3.6–5.1)
Alkaline Phosphatase: 79 U/L (ref 40–115)
BUN: 15 mg/dL (ref 7–25)
CALCIUM: 9.5 mg/dL (ref 8.6–10.3)
CHLORIDE: 105 mmol/L (ref 98–110)
CO2: 24 mmol/L (ref 20–31)
Creat: 0.88 mg/dL (ref 0.60–1.35)
Glucose, Bld: 91 mg/dL (ref 65–99)
POTASSIUM: 4.3 mmol/L (ref 3.5–5.3)
Sodium: 139 mmol/L (ref 135–146)
TOTAL PROTEIN: 6.9 g/dL (ref 6.1–8.1)
Total Bilirubin: 0.6 mg/dL (ref 0.2–1.2)

## 2015-01-02 LAB — CBC
HEMATOCRIT: 45.5 % (ref 39.0–52.0)
Hemoglobin: 16.1 g/dL (ref 13.0–17.0)
MCH: 31.3 pg (ref 26.0–34.0)
MCHC: 35.4 g/dL (ref 30.0–36.0)
MCV: 88.5 fL (ref 78.0–100.0)
MPV: 9.9 fL (ref 8.6–12.4)
PLATELETS: 185 10*3/uL (ref 150–400)
RBC: 5.14 MIL/uL (ref 4.22–5.81)
RDW: 13.1 % (ref 11.5–15.5)
WBC: 9.1 10*3/uL (ref 4.0–10.5)

## 2015-01-02 LAB — LIPASE: LIPASE: 14 U/L (ref 7–60)

## 2015-01-02 MED ORDER — OMEPRAZOLE 40 MG PO CPDR: 40.0000 mg | DELAYED_RELEASE_CAPSULE | Freq: Every day | ORAL | Status: DC

## 2015-01-02 NOTE — Assessment & Plan Note (Signed)
Unclear etiology possibly muscular. EKG normal. Obtain CBC CMP and lipase. Empiric treatment with omeprazole. Follow-up with PCP.

## 2015-01-02 NOTE — Progress Notes (Signed)
       Wayne BoastRyan Newman is a 39 y.o. male who [facility] today for epigastric abdominal pain present for one day. This is associated with mild fevers and chills sinus congestion and sore throat. He denies any vomiting diarrhea chest pain palpitations or shortness of breath. He's tried some over-the-counter medicines which helped a bit. Pain is epigastric bilaterally and nonradiating. Pain is moderate. His last bowel movement was today and is normal. She denies any blood in the stool.   Past Medical History  Diagnosis Date  . Thyroid disease    No past surgical history on file. Social History  Substance Use Topics  . Smoking status: Never Smoker   . Smokeless tobacco: Not on file  . Alcohol Use: Yes   family history is not on file.  ROS as above Medications: Current Outpatient Prescriptions  Medication Sig Dispense Refill  . amphetamine-dextroamphetamine (ADDERALL) 20 MG tablet Take 1.5 tablets (30 mg total) by mouth 2 (two) times daily. 90 tablet 0  . SYNTHROID 112 MCG tablet TAKE 2 TABLETS (224 MCG TOTAL) BY MOUTH DAILY. DISPENSE BRAND NAME SYNTHROID ONLY. 180 tablet 2  . omeprazole (PRILOSEC) 40 MG capsule Take 1 capsule (40 mg total) by mouth daily. 30 capsule 3   No current facility-administered medications for this visit.   Allergies  Allergen Reactions  . Sulfa Antibiotics Other (See Comments)    Given for pink eye & irritated eye more.     Exam:  BP 141/97 mmHg  Pulse 80  Temp(Src) 97.5 F (36.4 C) (Oral)  Wt 187 lb (84.823 kg)  SpO2 100% Gen: Well NAD nontoxic appearing HEENT: EOMI,  MMM Lungs: Normal work of breathing. CTABL Heart: RRR no MRG Abd: NABS, Soft. Nondistended, Nontender Exts: Brisk capillary refill, warm and well perfused.  Twelve-lead EKG shows normal sinus rhythm at 75 bpm with no ST segment elevation or depressions. Normal EKG.  No  prior studies to compare to  No results found for this or any previous visit (from the past 24 hour(s)). No results found.   Please see individual assessment and plan sections.

## 2015-01-02 NOTE — Patient Instructions (Addendum)
Thank you for coming in today. Get labs today.  Take omeprazole daily. Return sooner if worse. If your belly pain worsens, or you have high fever, bad vomiting, blood in your stool or black tarry stool go to the Emergency Room.   Abdominal Pain, Adult Many things can cause abdominal pain. Usually, abdominal pain is not caused by a disease and will improve without treatment. It can often be observed and treated at home. Your health care provider will do a physical exam and possibly order blood tests and X-rays to help determine the seriousness of your pain. However, in many cases, more time must pass before a clear cause of the pain can be found. Before that point, your health care provider may not know if you need more testing or further treatment. HOME CARE INSTRUCTIONS Monitor your abdominal pain for any changes. The following actions may help to alleviate any discomfort you are experiencing:  Only take over-the-counter or prescription medicines as directed by your health care provider.  Do not take laxatives unless directed to do so by your health care provider.  Try a clear liquid diet (broth, tea, or water) as directed by your health care provider. Slowly move to a bland diet as tolerated. SEEK MEDICAL CARE IF:  You have unexplained abdominal pain.  You have abdominal pain associated with nausea or diarrhea.  You have pain when you urinate or have a bowel movement.  You experience abdominal pain that wakes you in the night.  You have abdominal pain that is worsened or improved by eating food.  You have abdominal pain that is worsened with eating fatty foods.  You have a fever. SEEK IMMEDIATE MEDICAL CARE IF:  Your pain does not go away within 2 hours.  You keep throwing up (vomiting).  Your pain is felt only in portions of the abdomen, such as the right side or the left lower portion of the abdomen.  You pass bloody or black tarry stools. MAKE SURE YOU:  Understand these  instructions.  Will watch your condition.  Will get help right away if you are not doing well or get worse.   This information is not intended to replace advice given to you by your health care provider. Make sure you discuss any questions you have with your health care provider.   Document Released: 10/07/2004 Document Revised: 09/18/2014 Document Reviewed: 09/06/2012 Elsevier Interactive Patient Education [employer]2016 [employer] Inc.

## 2015-01-03 NOTE — Progress Notes (Signed)
Quick Note:  Labs do not show much ______

## 2015-01-31 ENCOUNTER — Ambulatory Visit (INDEPENDENT_AMBULATORY_CARE_PROVIDER_SITE_OTHER): Payer: 59 | Admitting: Family Medicine

## 2015-01-31 ENCOUNTER — Encounter: Payer: Self-pay | Admitting: Family Medicine

## 2015-01-31 VITALS — BP 120/75 | HR 89 | Wt 188.0 lb

## 2015-01-31 DIAGNOSIS — E038 Other specified hypothyroidism: Secondary | ICD-10-CM | POA: Diagnosis not present

## 2015-01-31 DIAGNOSIS — Z23 Encounter for immunization: Secondary | ICD-10-CM | POA: Diagnosis not present

## 2015-01-31 DIAGNOSIS — L309 Dermatitis, unspecified: Secondary | ICD-10-CM | POA: Diagnosis not present

## 2015-01-31 DIAGNOSIS — F909 Attention-deficit hyperactivity disorder, unspecified type: Secondary | ICD-10-CM

## 2015-01-31 DIAGNOSIS — F988 Other specified behavioral and emotional disorders with onset usually occurring in childhood and adolescence: Secondary | ICD-10-CM

## 2015-01-31 LAB — TSH: TSH: 1.89 u[IU]/mL (ref 0.350–4.500)

## 2015-01-31 MED ORDER — AMPHETAMINE-DEXTROAMPHETAMINE 20 MG PO TABS: 30.0000 mg | ORAL_TABLET | Freq: Two times a day (BID) | ORAL | Status: DC

## 2015-01-31 MED ORDER — TACROLIMUS 0.1 % EX OINT: TOPICAL_OINTMENT | Freq: Two times a day (BID) | CUTANEOUS | Status: DC

## 2015-01-31 NOTE — Progress Notes (Signed)
CC: Wayne Newman is a 40 y.o. male is here for TSH Labs   Subjective: HPI:  Follow-up ADD: Continues to take Adderall twice a day every work day. He denies any new side effects. Denies appetite suppression, anxiety, sleep disturbance or paranoia. He still believes it is helping greatly with inattentiveness at work.  Follow-up hypothyroidism: No unintentional weight loss or gain since I saw him last. Taking a double dose of 112 MCG Synthroid daily. Denies skin changes other than a return of eczema on the top of his right foot.  Follow-up eczema: Triamcinolone and clobetasol have been helpful however only temporarily, when he stops using either of them within a month he'll notice that it is itchy scaly rash appears on his right foot. He denies any pain. Denies swollen lymph nodes or skin lesions elsewhere   Review Of Systems Outlined In HPI  Past Medical History  Diagnosis Date  . Thyroid disease     No past surgical history on file. Family History  Problem Relation Age of Onset  . Alcohol abuse      grandfather  . Heart attack      grandfather  . Stroke      grandfather    Social History   Social History  . Marital Status: Married    Spouse Name: N/A  . Number of Children: N/A  . Years of Education: N/A   Occupational History  . Not on file.   Social History Main Topics  . Smoking status: Never Smoker   . Smokeless tobacco: Not on file  . Alcohol Use: Yes  . Drug Use: No  . Sexual Activity: Yes    Birth Control/ Protection: Condom   Other Topics Concern  . Not on file   Social History Narrative     Objective: BP 120/75 mmHg  Pulse 89  Wt 188 lb (85.276 kg)  General: Alert and Oriented, No Acute Distress HEENT: Pupils equal, round, reactive to light. Conjunctivae clear. Moist mucous membranes Lungs: Clear to auscultation bilaterally, no wheezing/ronchi/rales.  Comfortable work of breathing. Good air movement. Cardiac: Regular rate and rhythm. Normal S1/S2.   No murmurs, rubs, nor gallops.   Extremities: No peripheral edema.  Strong peripheral pulses.  Mental Status: No depression, anxiety, nor agitation. Skin: Warm and dry. Mild to moderate eczema changes on the right foot  Assessment & Plan: Wayne Newman was seen today for tsh labs.  Diagnoses and all orders for this visit:  ADD (attention deficit disorder) -     amphetamine-dextroamphetamine (ADDERALL) 20 MG tablet; Take 1.5 tablets (30 mg total) by mouth 2 (two) times daily.  Other specified hypothyroidism -     TSH  Eczema -     tacrolimus (PROTOPIC) 0.1 % ointment; Apply topically 2 (two) times daily. As needed for eczema spots.  Encounter for immunization  Other orders -     Cancel: SYNTHROID 112 MCG tablet; TAKE 2 TABLETS (224 MCG TOTAL) BY MOUTH DAILY. DISPENSE BRAND NAME SYNTHROID ONLY. -     Flu Vaccine QUAD 36+ mos IM   ADD: Controlled with Adderall, refills provided Hypothyroidism: Clinically controlled to for repeat TSH Eczema: Uncontrolled chronic condition starting Protopic  Return in about 6 months (around 07/31/2015) for Thyroid follow up.

## 2015-02-03 ENCOUNTER — Telehealth: Payer: Self-pay | Admitting: Family Medicine

## 2015-02-03 MED ORDER — SYNTHROID 112 MCG PO TABS: ORAL_TABLET | ORAL | Status: DC

## 2015-02-03 NOTE — Telephone Encounter (Signed)
Pt advised.

## 2015-02-03 NOTE — Telephone Encounter (Signed)
Will you please let patient know that his thyroid supplement appears to be adequate therefore [facility]'ll [facility]  I'd recommend f/u in 6 months to recheck this.

## 2015-02-05 ENCOUNTER — Telehealth: Payer: Self-pay | Admitting: Family Medicine

## 2015-02-05 NOTE — Telephone Encounter (Signed)
Received fax for pa on Tacrolimus sent through cover my meds. Received fax from [employer] and they denied coverage on Tacrolimus due to patient does not have history of failure, contraindication or intolerance to a topical corticosteroid or it is not being used for the facial or groin area. - CF

## 2015-02-05 NOTE — Telephone Encounter (Signed)
Wayne Newman, I received a denial letter from [facility] for Tacrolimus however they state it would have been approved if he has a history of failure to a topical corticosteroid. He has already met this criteria: -No benefit from clobetasol cream 0.05%12/2013 -No benefit from triamcinolone cream 0.1% 08/2013  Can you please resubmit PA this this info?  I'll bring the paper to triage room.

## 2015-02-06 ENCOUNTER — Telehealth: Payer: Self-pay | Admitting: Family Medicine

## 2015-02-06 NOTE — Telephone Encounter (Signed)
Spoke w/Cindy and pt's medication has been approved until 08/06/2015; new approval # [id number].Loralee Pacas [place]

## 2015-02-06 NOTE — Telephone Encounter (Signed)
Error

## 2015-03-20 ENCOUNTER — Other Ambulatory Visit: Payer: Self-pay

## 2015-03-20 DIAGNOSIS — F988 Other specified behavioral and emotional disorders with onset usually occurring in childhood and adolescence: Secondary | ICD-10-CM

## 2015-03-20 MED ORDER — AMPHETAMINE-DEXTROAMPHETAMINE 20 MG PO TABS: 30.0000 mg | ORAL_TABLET | Freq: Two times a day (BID) | ORAL | Status: DC

## 2015-04-23 ENCOUNTER — Ambulatory Visit (INDEPENDENT_AMBULATORY_CARE_PROVIDER_SITE_OTHER): Payer: Self-pay | Admitting: Family Medicine

## 2015-04-23 ENCOUNTER — Encounter: Payer: Self-pay | Admitting: Family Medicine

## 2015-04-23 VITALS — BP 110/72 | HR 74 | Wt 186.0 lb

## 2015-04-23 DIAGNOSIS — F988 Other specified behavioral and emotional disorders with onset usually occurring in childhood and adolescence: Secondary | ICD-10-CM

## 2015-04-23 DIAGNOSIS — L309 Dermatitis, unspecified: Secondary | ICD-10-CM

## 2015-04-23 DIAGNOSIS — F909 Attention-deficit hyperactivity disorder, unspecified type: Secondary | ICD-10-CM

## 2015-04-23 DIAGNOSIS — E038 Other specified hypothyroidism: Secondary | ICD-10-CM

## 2015-04-23 MED ORDER — AMPHETAMINE-DEXTROAMPHETAMINE 20 MG PO TABS: 30.0000 mg | ORAL_TABLET | Freq: Two times a day (BID) | ORAL | Status: DC

## 2015-04-23 MED ORDER — SYNTHROID 112 MCG PO TABS: ORAL_TABLET | ORAL | Status: DC

## 2015-04-23 MED ORDER — TRIAMCINOLONE ACETONIDE 0.1 % EX CREA: TOPICAL_CREAM | CUTANEOUS | Status: DC

## 2015-04-23 NOTE — Progress Notes (Signed)
CC: Wayne Newman is a 40 y.o. male is here for Medication Refill   Subjective: HPI:  FU ADD: Taking adderall daily with no known side effects. Denies sleep disturbance, suppressed appetite or anxiety. Still believes it's helping with focus at work. Denies any mental disturbance.  Follow-up eczema: He was unable to afford tacrolimus and has only been using Aveeno and attempts to suppress his outbreak on the right and left foot which is still present and itchy. He ran out of all corticosteroids. He denies skin changes elsewhere. No fevers, chills or swollen lymph nodes  He is requesting a refill on Synthroid. TSH was checked in January and was normal. He denies any unintentional weight loss or gain. No gastrointestinal complaints.   Review Of Systems Outlined In HPI  Past Medical History  Diagnosis Date  . Thyroid disease     No past surgical history on file. Family History  Problem Relation Age of Onset  . Alcohol abuse      grandfather  . Heart attack      grandfather  . Stroke      grandfather    Social History   Social History  . Marital Status: Married    Spouse Name: N/A  . Number of Children: N/A  . Years of Education: N/A   Occupational History  . Not on file.   Social History Main Topics  . Smoking status: Never Smoker   . Smokeless tobacco: Not on file  . Alcohol Use: Yes  . Drug Use: No  . Sexual Activity: Yes    Birth Control/ Protection: Condom   Other Topics Concern  . Not on file   Social History Narrative     Objective: BP 110/72 mmHg  Pulse 74  Wt 186 lb (84.369 kg)  Vital signs reviewed. General: Alert and Oriented, No Acute Distress HEENT: Pupils equal, round, reactive to light. Conjunctivae clear.  External ears unremarkable.  Moist mucous membranes. Lungs: Clear and comfortable work of breathing, speaking in full sentences without accessory muscle use. Cardiac: Regular rate and rhythm.  Neuro: CN II-XII grossly intact, gait  normal. Extremities: No peripheral edema.  Strong peripheral pulses.  Mental Status: No depression, anxiety, nor agitation. Logical though process. Skin: Warm and dry. Assessment & Plan: Wayne Newman was seen today for medication refill.  Diagnoses and all orders for this visit:  ADD (attention deficit disorder) -     Discontinue: amphetamine-dextroamphetamine (ADDERALL) 20 MG tablet; Take 1.5 tablets (30 mg total) by mouth 2 (two) times daily. Due for follow up in October 2017 -     Discontinue: amphetamine-dextroamphetamine (ADDERALL) 20 MG tablet; Take 1.5 tablets (30 mg total) by mouth 2 (two) times daily. May fill on/after May 10th 2017. Due for follow up in October 2017 -     amphetamine-dextroamphetamine (ADDERALL) 20 MG tablet; Take 1.5 tablets (30 mg total) by mouth 2 (two) times daily. May fill on/after June 10th 2017. Due for follow up in October 2017  Other specified hypothyroidism  Eczema  Other orders -     triamcinolone cream (KENALOG) 0.1 %; Apply to affected areas twice a day for up to two weeks, avoid face. -     SYNTHROID 112 MCG tablet; TAKE 2 TABLETS (224 MCG TOTAL) BY MOUTH DAILY. DISPENSE BRAND NAME SYNTHROID ONLY.   ADD: Controlled on Adderall, refills for 3 months were provided and also let him know that he doesn't need to follow-up until October since he's been on the same dose for so  long and is such a low risk for misuse of this medication. He can call in 3 months for another 3 months of prescriptions to be printed off. Hypothyroidism: Clinically controlled due for repeat TSH in 3-6 months, again this can be put off until October feet like. Eczema: Uncontrolled chronic condition restart on triamcinolone cream  Return in about 6 months (around 10/23/2015).

## 2015-08-20 ENCOUNTER — Other Ambulatory Visit: Payer: Self-pay | Admitting: Family Medicine

## 2015-08-20 DIAGNOSIS — F988 Other specified behavioral and emotional disorders with onset usually occurring in childhood and adolescence: Secondary | ICD-10-CM

## 2015-08-20 MED ORDER — AMPHETAMINE-DEXTROAMPHETAMINE 20 MG PO TABS: 30.0000 mg | ORAL_TABLET | Freq: Two times a day (BID) | ORAL | 0 refills | Status: DC

## 2015-08-20 NOTE — Telephone Encounter (Signed)
Due for follow-up in October with provider of choice since I will be leaving in September. Two-months of refills will be approved and printed in your in box

## 2015-08-20 NOTE — Telephone Encounter (Signed)
Pt called clinic today requesting 3 month supply of Adderall. Will route to PCP for review.

## 2015-08-21 NOTE — Telephone Encounter (Signed)
vm left for pt

## 2015-11-05 ENCOUNTER — Telehealth: Payer: Self-pay | Admitting: Osteopathic Medicine

## 2015-11-05 DIAGNOSIS — F988 Other specified behavioral and emotional disorders with onset usually occurring in childhood and adolescence: Secondary | ICD-10-CM

## 2015-11-05 MED ORDER — AMPHETAMINE-DEXTROAMPHETAMINE 20 MG PO TABS: 30.0000 mg | ORAL_TABLET | Freq: Two times a day (BID) | ORAL | 0 refills | Status: DC

## 2015-11-05 NOTE — Telephone Encounter (Signed)
Reviewed Dr. Genelle BalHommel's last note, patient was due for follow-up 6 months from last visit which would've been earlier this month. I'm okay to do 15 days of medication but the patient needs to schedule a follow-up to establish with either myself or provider of choice.

## 2015-11-05 NOTE — Telephone Encounter (Signed)
Pt advised of Rx, transferred to scheduling to make appt.

## 2015-11-05 NOTE — Telephone Encounter (Signed)
Pt called clinic requesting refill on Adderall Rx. Pt informed he needs to make an appt to est care with a different Provider. Pt will call back to schedule with Dr. Lyn HollingsheadAlexander. Will route to Provider for review on Rx refill.

## 2015-11-17 ENCOUNTER — Ambulatory Visit (INDEPENDENT_AMBULATORY_CARE_PROVIDER_SITE_OTHER): Payer: Self-pay | Admitting: Osteopathic Medicine

## 2015-11-17 ENCOUNTER — Encounter: Payer: Self-pay | Admitting: Osteopathic Medicine

## 2015-11-17 VITALS — BP 129/91 | HR 88 | Ht 72.0 in | Wt 188.0 lb

## 2015-11-17 DIAGNOSIS — R030 Elevated blood-pressure reading, without diagnosis of hypertension: Secondary | ICD-10-CM

## 2015-11-17 DIAGNOSIS — F988 Other specified behavioral and emotional disorders with onset usually occurring in childhood and adolescence: Secondary | ICD-10-CM

## 2015-11-17 DIAGNOSIS — E038 Other specified hypothyroidism: Secondary | ICD-10-CM

## 2015-11-17 MED ORDER — SYNTHROID 112 MCG PO TABS: ORAL_TABLET | ORAL | 2 refills | Status: DC

## 2015-11-17 MED ORDER — AMPHETAMINE-DEXTROAMPHETAMINE 20 MG PO TABS: 30.0000 mg | ORAL_TABLET | Freq: Two times a day (BID) | ORAL | 0 refills | Status: DC

## 2015-11-17 NOTE — Patient Instructions (Signed)
For controlled substances - plan to update contract annually / if any changes to your pharmacy. You were provided with 6952-month prescriptions of medications at this time, you are responsible for these prescriptions as stated in the contract. Thanks for bearing with us as we implement this policy! Please let us know if any questions or concerns.   For blood pressure: bottom nu,ber is very slightly elevated, plan to keep an eye on this at your future visits but it's not worrisome for needing to start a blood pressure medication right now.   Plan to have labs done today, will call you with results!

## 2015-11-17 NOTE — Progress Notes (Signed)
HPI: Wayne Newman is a 40 y.o. male  who presents to [facility] today, 11/17/15,  for chief complaint of:  Chief Complaint  Patient presents with  . Establish Care     switching from [place]/medication refill    Attention deficit disorder: Stable on current medications for several years. No controlled substance contract in place, we'll take care of this today.  Thyroid: Patient states congenital hypothyroidism, previously on generic levothyroxine by TSH levels were erratic, physician in [place] placed him on brand-name Synthroid and he has been doing fine on this ever since.  Sleep apnea: not using CPAP, sleep study, showed mild. Sleep study was initially done for fatigue, patient does not complain of daytime somnolence.     Past medical, surgical, social and family history reviewed: Past Medical History:  Diagnosis Date  . Thyroid disease    No past surgical history on file. Social History  Substance Use Topics  . Smoking status: Never Smoker  . Smokeless tobacco: Never Used  . Alcohol use Yes   Family History  Problem Relation Age of Onset  . Alcohol abuse      grandfather  . Heart attack      grandfather  . Stroke      grandfather     Current medication list and allergy/intolerance information reviewed:   Current Outpatient Prescriptions on File Prior to Visit  Medication Sig Dispense Refill  . amphetamine-dextroamphetamine (ADDERALL) 20 MG tablet Take 1.5 tablets (30 mg total) by mouth 2 (two) times daily. 45 tablet 0  . omeprazole (PRILOSEC) 40 MG capsule Take 1 capsule (40 mg total) by mouth daily. 30 capsule 3  . SYNTHROID 112 MCG tablet TAKE 2 TABLETS (224 MCG TOTAL) BY MOUTH DAILY. DISPENSE BRAND NAME SYNTHROID ONLY. 180 tablet 2  . triamcinolone cream (KENALOG) 0.1 % Apply to affected areas twice a day for up to two weeks, avoid face. 80 g 2   No current facility-administered medications on file prior to visit.     Allergies  Allergen Reactions  . Sulfa Antibiotics Other (See Comments)    Given for pink eye & irritated eye more.      Review of Systems:  Constitutional: No recent illness  HEENT: No  headache, no vision change  Cardiac: No  chest pain, No  pressure, No palpitations  Respiratory:  No  shortness of breath. No  Cough  Gastrointestinal: No  abdominal pain, no change on bowel habits   Musculoskeletal: No new myalgia/arthralgia  Exam:  BP (!) 134/91   Pulse 88   Ht 6' (1.829 m)   Wt 188 lb (85.3 kg)   BMI 25.50 kg/m   Constitutional: VS see above. General Appearance: alert, well-developed, well-nourished, NAD  Eyes: Normal lids and conjunctive, non-icteric sclera  Ears, Nose, Mouth, Throat: MMM, Normal external inspection ears/nares/mouth/lips/gums.  Neck: No masses, trachea midline.   Respiratory: Normal respiratory effort. no wheeze, no rhonchi, no rales  Cardiovascular: S1/S2 normal, no murmur, no rub/gallop auscultated. RRR.   Musculoskeletal: Gait normal. Symmetric and independent movement of all extremities  Neurological: Normal balance/coordination. No tremor.  Skin: warm, dry, intact.   Psychiatric: Normal judgment/insight. Normal mood and affect. Oriented x3.     ASSESSMENT/PLAN:   Three-month supply of Adderall was provided to the patient  We'll go ahead and get routine labs done, history of mildly elevated liver enzymes  Blood pressure rechecked, diastolic borderline at 91, follow closely  Other specified hypothyroidism - Plan: CBC with Differential/Platelet,  COMPLETE METABOLIC PANEL WITH GFR, Lipid panel, TSH  Attention deficit disorder, unspecified hyperactivity presence - Plan: amphetamine-dextroamphetamine (ADDERALL) 20 MG tablet, DISCONTINUED: amphetamine-dextroamphetamine (ADDERALL) 20 MG tablet, DISCONTINUED: amphetamine-dextroamphetamine (ADDERALL) 20 MG tablet, DISCONTINUED: amphetamine-dextroamphetamine (ADDERALL) 20 MG  tablet  Elevated BP without diagnosis of hypertension    Patient Instructions  For controlled substances - plan to update contract annually / if any changes to your pharmacy. You were provided with 4032-month prescriptions of medications at this time, you are responsible for these prescriptions as stated in the contract. Thanks for bearing with us as we implement this policy! Please let us know if any questions or concerns.   For blood pressure: bottom nu,ber is very slightly elevated, plan to keep an eye on this at your future visits but it's not worrisome for needing to start a blood pressure medication right now.   Plan to have labs done today, will call you with results!     Visit summary with medication list and pertinent instructions was printed for patient to review. All questions at time of visit were answered - patient instructed to contact office with any additional concerns. ER/RTC precautions were reviewed with the patient. Follow-up plan: Return in about 3 months (around 02/17/2016) for ANNUAL PHYSICAL - W/  ADHD RECHECK, BLOOD PRESSURE RECHECK, sooner if needed.

## 2015-11-18 ENCOUNTER — Other Ambulatory Visit: Payer: Self-pay | Admitting: Osteopathic Medicine

## 2015-11-18 LAB — COMPLETE METABOLIC PANEL WITH GFR
ALBUMIN: 4.6 g/dL (ref 3.6–5.1)
ALK PHOS: 56 U/L (ref 40–115)
ALT: 61 U/L — ABNORMAL HIGH (ref 9–46)
AST: 42 U/L — ABNORMAL HIGH (ref 10–40)
BILIRUBIN TOTAL: 0.5 mg/dL (ref 0.2–1.2)
BUN: 15 mg/dL (ref 7–25)
CALCIUM: 9.5 mg/dL (ref 8.6–10.3)
CO2: 30 mmol/L (ref 20–31)
Chloride: 104 mmol/L (ref 98–110)
Creat: 1 mg/dL (ref 0.60–1.35)
Glucose, Bld: 98 mg/dL (ref 65–99)
Potassium: 3.8 mmol/L (ref 3.5–5.3)
Sodium: 139 mmol/L (ref 135–146)
TOTAL PROTEIN: 6.7 g/dL (ref 6.1–8.1)

## 2015-11-18 LAB — CBC WITH DIFFERENTIAL/PLATELET
BASOS ABS: 0 {cells}/uL (ref 0–200)
Basophils Relative: 0 %
EOS ABS: 456 {cells}/uL (ref 15–500)
Eosinophils Relative: 6 %
HCT: 47.4 % (ref 38.5–50.0)
HEMOGLOBIN: 16.2 g/dL (ref 13.2–17.1)
LYMPHS ABS: 2356 {cells}/uL (ref 850–3900)
Lymphocytes Relative: 31 %
MCH: 30.9 pg (ref 27.0–33.0)
MCHC: 34.2 g/dL (ref 32.0–36.0)
MCV: 90.5 fL (ref 80.0–100.0)
MONOS PCT: 9 %
MPV: 10.4 fL (ref 7.5–12.5)
Monocytes Absolute: 684 cells/uL (ref 200–950)
NEUTROS ABS: 4104 {cells}/uL (ref 1500–7800)
Neutrophils Relative %: 54 %
Platelets: 203 10*3/uL (ref 140–400)
RBC: 5.24 MIL/uL (ref 4.20–5.80)
RDW: 13.3 % (ref 11.0–15.0)
WBC: 7.6 10*3/uL (ref 3.8–10.8)

## 2015-11-18 LAB — LIPID PANEL
Cholesterol: 185 mg/dL (ref <200)
HDL: 50 mg/dL (ref >40)
LDL Cholesterol: 114 mg/dL — ABNORMAL HIGH
TRIGLYCERIDES: 106 mg/dL (ref <150)
Total CHOL/HDL Ratio: 3.7 Ratio (ref <5.0)
VLDL: 21 mg/dL (ref <30)

## 2015-11-18 LAB — TSH: TSH: 8.23 mIU/L — ABNORMAL HIGH (ref 0.40–4.50)

## 2015-11-18 MED ORDER — SYNTHROID 125 MCG PO TABS: 250.0000 ug | ORAL_TABLET | Freq: Every day | ORAL | 1 refills | Status: DC

## 2015-11-18 NOTE — Addendum Note (Signed)
Addended by: Deirdre PippinsALEXANDER, Huck Ashworth M on: 11/18/2015 07:18 AM   Modules accepted: Orders

## 2016-02-16 ENCOUNTER — Ambulatory Visit (INDEPENDENT_AMBULATORY_CARE_PROVIDER_SITE_OTHER): Payer: 59 | Admitting: Osteopathic Medicine

## 2016-02-16 ENCOUNTER — Encounter: Payer: Self-pay | Admitting: Osteopathic Medicine

## 2016-02-16 VITALS — BP 102/68 | HR 92 | Ht 72.0 in | Wt 201.0 lb

## 2016-02-16 DIAGNOSIS — R748 Abnormal levels of other serum enzymes: Secondary | ICD-10-CM | POA: Diagnosis not present

## 2016-02-16 DIAGNOSIS — E039 Hypothyroidism, unspecified: Secondary | ICD-10-CM

## 2016-02-16 DIAGNOSIS — M545 Low back pain, unspecified: Secondary | ICD-10-CM

## 2016-02-16 DIAGNOSIS — F988 Other specified behavioral and emotional disorders with onset usually occurring in childhood and adolescence: Secondary | ICD-10-CM

## 2016-02-16 DIAGNOSIS — G8929 Other chronic pain: Secondary | ICD-10-CM

## 2016-02-16 DIAGNOSIS — Z23 Encounter for immunization: Secondary | ICD-10-CM

## 2016-02-16 MED ORDER — AMPHETAMINE-DEXTROAMPHETAMINE 20 MG PO TABS: 30.0000 mg | ORAL_TABLET | Freq: Two times a day (BID) | ORAL | 0 refills | Status: DC

## 2016-02-16 NOTE — Patient Instructions (Signed)
Plan:  Labs in a few days after no alcohol  Can get Xray of the lower back at that time as well  You'll be getting a call about setting up physical therapy  If PT not helpful, would plan to follow-up with sports medicine in our office  Plan to follow-up with me in 3 months for ADD medications and annual physical

## 2016-02-16 NOTE — Progress Notes (Signed)
HPI: Wayne BoastRyan Newman is a 41 y.o. male  who presents to [facility] today, 02/16/16,  for chief complaint of:  Chief Complaint  Patient presents with  . Follow-up    ADD, BLOOD PRESSURE    Low back pain  . Context: Hx 2 bulged discs per chiropractor, played a lot of sports but no specific back injury  . Location: lower back on left, no shooting pains down legs  . Quality: soreness, tightness . Duration: ongoing for several years  . Modifying factors: taking Ibuprofen and exercises with foam roller, no formal PT  Liver enzymes hx mild elevation liver enzymes, due for recheck. Drank a good pit of alcohol last night with super bowl. No jaundice, no abd pain.   Hypothyroid Recently adjusted dose, due for recheck labs.   Attention deficit Doing well on current medication dose, requests 3 months refill.    Past medical history, surgical history, social history and family history reviewed.  Patient Active Problem List   Diagnosis Date Noted  . Eczema 04/23/2015  . Abdominal pain, epigastric 01/02/2015  . Mild sleep apnea 02/20/2013  . Fatigue 12/20/2012  . Elevated LFTs 09/29/2012  . ADD (attention deficit disorder) 08/24/2012  . Hypothyroid 08/24/2012    Current medication list and allergy/intolerance information reviewed.   Current Outpatient Prescriptions on File Prior to Visit  Medication Sig Dispense Refill  . amphetamine-dextroamphetamine (ADDERALL) 20 MG tablet Take 1.5 tablets (30 mg total) by mouth 2 (two) times daily. 90 tablet 0  . omeprazole (PRILOSEC) 40 MG capsule Take 1 capsule (40 mg total) by mouth daily. 30 capsule 3  . SYNTHROID 125 MCG tablet Take 2 tablets (250 mcg total) by mouth daily before breakfast. 180 tablet 1  . triamcinolone cream (KENALOG) 0.1 % Apply to affected areas twice a day for up to two weeks, avoid face. 80 g 2   No current facility-administered medications on file prior to visit.    Allergies  Allergen  Reactions  . Sulfa Antibiotics Other (See Comments)    Given for pink eye & irritated eye more.      Review of Systems:  Constitutional: No recent illness  HEENT: No  headache, no vision change  Cardiac: No  chest pain, No  pressure, No palpitations  Respiratory:  No  shortness of breath. No  Cough  Gastrointestinal: No  abdominal pain  Musculoskeletal: No new myalgia/arthralgia, + back pain as above  Exam:  BP 102/68   Pulse 92   Ht 6' (1.829 m)   Wt 201 lb (91.2 kg)   BMI 27.26 kg/m   Constitutional: VS see above. General Appearance: alert, well-developed, well-nourished, NAD  Eyes: Normal lids and conjunctive, non-icteric sclera  Ears, Nose, Mouth, Throat: MMM, Normal external inspection ears/nares/mouth/lips/gums.  Neck: No masses, trachea midline.   Respiratory: Normal respiratory effort. no wheeze, no rhonchi, no rales  Cardiovascular: S1/S2 normal, no murmur, no rub/gallop auscultated. RRR.   Musculoskeletal: Gait normal. Symmetric and independent movement of all extremities. Discomfort with lower back extension, better range of motion to forward flexion, tenderness on left side with rotation to the left not to the right. Negative straight leg raise bilaterally.  Neurological: Normal balance/coordination. No tremor.  Skin: warm, dry, intact.   Psychiatric: Normal judgment/insight. Normal mood and affect. Oriented x3.    ASSESSMENT/PLAN:   Chronic left-sided low back pain without sciatica - Plan: DG Lumbar Spine Complete, Ambulatory referral to Physical Therapy  Hypothyroidism, unspecified type - Plan: TSH  Elevated liver enzymes - Plan: COMPLETE METABOLIC PANEL WITH GFR  Flu vaccine need - Plan: CANCELED: Flu Vaccine QUAD 36+ mos IM  Attention deficit disorder, unspecified hyperactivity presence - Plan: amphetamine-dextroamphetamine (ADDERALL) 20 MG tablet, DISCONTINUED: amphetamine-dextroamphetamine (ADDERALL) 20 MG tablet, DISCONTINUED:  amphetamine-dextroamphetamine (ADDERALL) 20 MG tablet  Need for prophylactic vaccination and inoculation against influenza - Plan: Flu Vaccine QUAD 36+ mos IM    Patient Instructions  Plan:  Labs in a few days after no alcohol  Can get Xray of the lower back at that time as well  You'll be getting a call about setting up physical therapy  If PT not helpful, would plan to follow-up with sports medicine in our office  Plan to follow-up with me in 3 months for ADD medications and annual physical     Follow-up plan: Return in about 3 months (around 05/15/2016) for ANNUAL PHYSICAL PREVENTIVE CARE - sooner if needed.  Visit summary with medication list and pertinent instructions was printed for patient to review, alert [place] if any changes needed. All questions at time of visit were answered - patient instructed to contact office with any additional concerns. ER/RTC precautions were reviewed with the patient and understanding verbalized.

## 2016-03-02 LAB — COMPLETE METABOLIC PANEL WITH GFR
ALBUMIN: 4.3 g/dL (ref 3.6–5.1)
ALK PHOS: 52 U/L (ref 40–115)
ALT: 40 U/L (ref 9–46)
AST: 33 U/L (ref 10–40)
BILIRUBIN TOTAL: 0.8 mg/dL (ref 0.2–1.2)
BUN: 18 mg/dL (ref 7–25)
CO2: 28 mmol/L (ref 20–31)
Calcium: 9.6 mg/dL (ref 8.6–10.3)
Chloride: 107 mmol/L (ref 98–110)
Creat: 0.94 mg/dL (ref 0.60–1.35)
GFR, Est African American: >89 mL/min (ref >=60)
GFR, Est Non African American: >89 mL/min (ref >=60)
GLUCOSE: 81 mg/dL (ref 65–99)
Potassium: 4.6 mmol/L (ref 3.5–5.3)
SODIUM: 141 mmol/L (ref 135–146)
TOTAL PROTEIN: 6.5 g/dL (ref 6.1–8.1)

## 2016-03-02 LAB — TSH: TSH: 0.47 mIU/L (ref 0.40–4.50)

## 2016-03-02 NOTE — Addendum Note (Signed)
Addended by: Deirdre PippinsALEXANDER, Sinaya Minogue M on: 03/02/2016 04:57 PM   Modules accepted: Orders

## 2016-03-03 ENCOUNTER — Ambulatory Visit (INDEPENDENT_AMBULATORY_CARE_PROVIDER_SITE_OTHER): Payer: 59 | Admitting: Physical Therapy

## 2016-03-03 ENCOUNTER — Encounter: Payer: Self-pay | Admitting: Physical Therapy

## 2016-03-03 DIAGNOSIS — G8929 Other chronic pain: Secondary | ICD-10-CM

## 2016-03-03 DIAGNOSIS — M6281 Muscle weakness (generalized): Secondary | ICD-10-CM

## 2016-03-03 DIAGNOSIS — M545 Low back pain: Secondary | ICD-10-CM

## 2016-03-03 NOTE — Therapy (Signed)
[facility] [address] [place], [place], [address] Phone: [phone]   Fax:  [phone]  Physical Therapy Evaluation  Patient Details  Name: Wayne Newman MRN: [id number] Date of Birth: October 13, 1975 Referring Provider: Dr Lyn Hollingshead   Encounter Date: 03/03/2016      PT End of Session - 03/03/16 0801    Visit Number 1   Number of Visits 4   Date for PT Re-Evaluation 03/31/16   PT Start Time 0801   PT Stop Time 0904   PT Time Calculation (min) 63 min   Activity Tolerance Patient tolerated treatment well      Past Medical History:  Diagnosis Date  . Thyroid disease     History reviewed. No pertinent surgical history.  There were no vitals filed for this visit.       Subjective Assessment - 03/03/16 0801    Subjective Pt reports he has episodic LBP that is usually triggered by an activity, this episode started about a month ago and he doesn't remember a specific episode that caused it to start. Was having trouble walking, he has been doing some exercises and stetches and they are helping some.   Started running and is wearing a back brace for this.    How long can you sit comfortably? no limitations   How long can you walk comfortably? no limitations   Diagnostic tests none   Patient Stated Goals Returning to exercise - running, lifting free weights, DIY stuff,    Currently in Pain? Yes   Pain Score 2    Pain Location Back   Pain Orientation Left   Pain Descriptors / Indicators Dull   Pain Type Chronic pain   Pain Radiating Towards SIJ area   Pain Onset 1 to 4 weeks ago   Pain Frequency Constant   Aggravating Factors  trunk rotation and bending forward   Pain Relieving Factors stretches             [facility] PT Assessment - 03/03/16 0001      Assessment   Medical Diagnosis Lt sided chronic LBP   Referring Provider Dr Lyn Hollingshead    Onset Date/Surgical Date 02/04/16   Hand Dominance Left   Next MD Visit  PRN   Prior Therapy none     Precautions   Precautions None   Precaution Comments wearing back brace for running     Balance Screen   Has the patient fallen in the past 6 months No     Prior Function   Level of Independence Independent   Vocation Full time employment   Vocation Requirements desk    Leisure outside activities, play with kids.      Observation/Other Assessments   Focus on Therapeutic Outcomes (FOTO)  33% limited     Functional Tests   Functional tests Squat;Single Leg Squat;Single leg stance     Squat   Comments shift to Rt      Single Leg Squat   Comments good     Single Leg Stance   Comments bilat WNL     Posture/Postural Control   Posture/Postural Control No significant limitations     ROM / Strength   AROM / PROM / Strength AROM;Strength     AROM   AROM Assessment Site Lumbar   Lumbar - Right Side Bend decreased 25% with pain     Strength   Strength Assessment Site Hip;Knee;Ankle;Lumbar   Right/Left Hip Right;Left   Right Hip Flexion 5/5  Right Hip Extension 5/5   Right Hip ABduction 4/5   Left Hip Flexion 5/5   Left Hip Extension 5/5   Left Hip ABduction 4+/5   Right/Left Knee --  Rt WNL, Lt grossly 4+/5   Right/Left Ankle --  bilat WNL   Lumbar Flexion --  TA good(-)    Lumbar Extension --  multifidi good     Flexibility   Soft Tissue Assessment /Muscle Length yes   Hamstrings supine SLR Lt 74, Rt 86     Palpation   Spinal mobility slight hypomobility in lumbar spine with CPA mobs   Palpation comment tight and tender in Lt lumbar paraspinals L5-2an dQL                   [facility] Adult PT Treatment/Exercise - 03/03/16 0001      Self-Care   Self-Care Other Self-Care Comments   Other Self-Care Comments  self trigger point release to lumbar multifidi and longisiumus     Exercises   Exercises Other Exercises   Other Exercises  LTR stretch each side for low back.      Modalities   Modalities Electrical  Stimulation;Moist Heat     Moist Heat Therapy   Number Minutes Moist Heat 15 Minutes   Moist Heat Location Lumbar Spine     Electrical Stimulation   Electrical Stimulation Location lumbar Lt    Electrical Stimulation Action IFC    Electrical Stimulation Parameters to toleranc   Electrical Stimulation Goals Tone;Pain     Manual Therapy   Manual Therapy Soft tissue mobilization;Joint mobilization   Joint Mobilization grade III Lumbar CPA and Lt UPA   Soft tissue mobilization STW to lumbar paraspinals          Trigger Point Dry Needling - 03/03/16 0843    Consent Given? Yes   Education Handout Provided Yes   Muscles Treated Upper Body Longissimus   Longissimus Response Palpable increased muscle length;Twitch response elicited  Lt L5-2 with stim              PT Education - 03/03/16 0847    Education provided Yes   Education Details HEP, DN   Person(s) Educated Patient   Methods Explanation;Demonstration;Handout   Comprehension Returned demonstration;Verbalized understanding             PT Long Term Goals - 03/03/16 0905      PT LONG TERM GOAL #1   Title I with advanced HEP to include running without back brace ( 03/31/16)    Time 4   Period Weeks   Status New     PT LONG TERM GOAL #2   Title demo bilat hip abduction 5/5 to support body with running ( 03/31/16)    Time 4   Period Weeks   Status New     PT LONG TERM GOAL #3   Title report no pain in the Lt low back with daily activity ( 03/31/16)    Time 4   Period Weeks   Status New     PT LONG TERM GOAL #4   Title improve FOTO =/< 24% limited ( 03/31/16)    Time 4   Period Weeks   Status New               Plan - 03/03/16 0901    Clinical Impression Statement 41 yo male presents for low complexity PT eval.  He has Lt sided Low back pain for about 2 weeks, he has tightness  in the Lt lumbar paraspinals, some hypomobility in the vertebrae due to this along with pain.  He is trying to return to  running and is wearing a back brace for this.  He needs to work on high level single leg strengthening and core work to support him for running once the muscles release.  He tolerated DN and manual work well today   Rehab Potential Excellent   PT Frequency 1x / week   PT Duration 4 weeks   PT Treatment/Interventions Moist Heat;Ultrasound;Therapeutic exercise;Dry needling;Manual techniques;Neuromuscular re-education;Cryotherapy;Electrical Stimulation;Patient/family education   PT Next Visit Plan progress to strengthening core and higher level SLS activities, manual work to back if muscles are still tight.    Consulted and Agree with Plan of Care Patient      Patient will benefit from skilled therapeutic intervention in order to improve the following deficits and impairments:  Decreased range of motion, Pain, Increased muscle spasms, Decreased strength, Hypomobility  Visit Diagnosis: Chronic left-sided low back pain without sciatica - Plan: PT plan of care cert/re-cert  Muscle weakness (generalized) - Plan: PT plan of care cert/re-cert     Problem List Patient Active Problem List   Diagnosis Date Noted  . Eczema 04/23/2015  . Abdominal pain, epigastric 01/02/2015  . Mild sleep apnea 02/20/2013  . Fatigue 12/20/2012  . Elevated LFTs 09/29/2012  . ADD (attention deficit disorder) 08/24/2012  . Hypothyroid 08/24/2012    Roderic Scarce PT 03/03/2016, 9:09 AM  [facility] [address] [place], [place], [address] Phone: [phone]   Fax:  [phone]  Name: Wayne Newman MRN: [id number] Date of Birth: 04-19-1975

## 2016-03-03 NOTE — Patient Instructions (Addendum)
   Self trigger point release to low back.  Use a firm ball and lie on it to release the tight muscles.  Hold 30-60 sec until discomfort decreases.   Lumbar Rotation (Non-Weight Bearing) - to get a deeper stretch slightly lengthen to lower leg and put over pressure on the top.     Feet on floor, slowly rock knees from side to side in small, pain-free range of motion. Allow lower back to rotate slightly. Hold 20-30 sec Repeat __2__ times per set. Do _1___ sets per session. Do __1__ sessions per day.  Lumbar Rotation Stretch    Lie on back with left knee drawn toward chest. Slowly bring bent leg across body until stretch is felt in lower back/hip area. Hold _30___ seconds. Repeat __1__ times per set. Do __1__ sets per session. Do __1__ sessions per day.   Trigger Point Dry Needling  . What is Trigger Point Dry Needling (DN)? o DN is a physical therapy technique used to treat muscle pain and dysfunction. Specifically, DN helps deactivate muscle trigger points (muscle knots).  o A thin filiform needle is used to penetrate the skin and stimulate the underlying trigger point. The goal is for a local twitch response (LTR) to occur and for the trigger point to relax. No medication of any kind is injected during the procedure.   . What Does Trigger Point Dry Needling Feel Like?  o The procedure feels different for each individual patient. Some patients report that they do not actually feel the needle enter the skin and overall the process is not painful. Very mild bleeding may occur. However, many patients feel a deep cramping in the muscle in which the needle was inserted. This is the local twitch response.   Marland Kitchen. How Will I feel after the treatment? o Soreness is normal, and the onset of soreness may not occur for a few hours. Typically this soreness does not last longer than two days.  o Bruising is uncommon, however; ice can be used to decrease any possible bruising.  o In rare cases feeling  tired or nauseous after the treatment is normal. In addition, your symptoms may get worse before they get better, this period will typically not last longer than 24 hours.   . What Can I do After My Treatment? o Increase your hydration by drinking more water for the next 24 hours. o You may place ice or heat on the areas treated that have become sore, however, do not use heat on inflamed or bruised areas. Heat often brings more relief post needling. o You can continue your regular activities, but vigorous activity is not recommended initially after the treatment for 24 hours. o DN is best combined with other physical therapy such as strengthening, stretching, and other therapies.

## 2016-03-11 ENCOUNTER — Encounter: Payer: Self-pay | Admitting: Physical Therapy

## 2016-03-11 ENCOUNTER — Ambulatory Visit (INDEPENDENT_AMBULATORY_CARE_PROVIDER_SITE_OTHER): Payer: 59 | Admitting: Physical Therapy

## 2016-03-11 DIAGNOSIS — M6281 Muscle weakness (generalized): Secondary | ICD-10-CM | POA: Diagnosis not present

## 2016-03-11 DIAGNOSIS — G8929 Other chronic pain: Secondary | ICD-10-CM | POA: Diagnosis not present

## 2016-03-11 DIAGNOSIS — M545 Low back pain, unspecified: Secondary | ICD-10-CM

## 2016-03-11 NOTE — Patient Instructions (Signed)

## 2016-03-11 NOTE — Therapy (Signed)
[facility] [place] [address] [place], [place], [address] Phone: [phone]   Fax:  [phone]  Physical Therapy Treatment  Patient Details  Name: Wayne Newman MRN: [id number] Date of Birth: [birth year] Referring Provider: Dr Lyn Hollingshead   Encounter Date: 03/11/2016      PT End of Session - 03/11/16 1624    Visit Number 2   Number of Visits 4   Date for PT Re-Evaluation 03/31/16   PT Start Time 1624   PT Stop Time 1719   PT Time Calculation (min) 55 min   Activity Tolerance Patient tolerated treatment well      Past Medical History:  Diagnosis Date  . Thyroid disease     History reviewed. No pertinent surgical history.  There were no vitals filed for this visit.      Subjective Assessment - 03/11/16 1621    Subjective Wayne Newman was feeling better after  the last treatment then slipped and fell in the back of his truck on Saturday. He has been doing the stretches and using the ball to try and calm it back down.     Currently in Pain? Yes   Pain Score 4    Pain Location Back   Pain Orientation Left  next to spine at belt level.    Pain Descriptors / Indicators Aching   Pain Type Chronic pain   Pain Onset 1 to 4 weeks ago   Pain Frequency Constant   Aggravating Factors  rotation and forward flexion, donning socks and shoes,prolonged sitting   Pain Relieving Factors standing and walking.                          [facility] Adult PT Treatment/Exercise - 03/11/16 0001      Exercises   Other Exercises  cat/cow, childs pose     Modalities   Modalities Electrical Stimulation;Moist Heat     Moist Heat Therapy   Number Minutes Moist Heat 15 Minutes   Moist Heat Location Lumbar Spine     Electrical Stimulation   Electrical Stimulation Location lumbar Lt    Electrical Stimulation Action IFC    Electrical Stimulation Parameters to tolerance   Electrical Stimulation Goals Tone;Pain     Manual Therapy   Manual Therapy Soft tissue mobilization;Joint mobilization   Joint Mobilization grade III L4-5 Lt CPA mobs , Lt hip posterior mobs.   Soft tissue mobilization STW to lumbar paraspinals.           Trigger Point Dry Needling - 03/11/16 1643    Consent Given? Yes   Education Handout Provided No   Muscles Treated Upper Body Longissimus   Longissimus Response Palpable increased muscle length;Twitch response elicited  L5-T10 with stim              PT Education - 03/11/16 1644    Education provided Yes   Education Details home TENs   Person(s) Educated Patient   Methods Explanation;Handout   Comprehension Verbalized understanding             PT Long Term Goals - 03/11/16 1644      PT LONG TERM GOAL #1   Title I with advanced HEP to include running without back brace ( 03/31/16)    Status On-going     PT LONG TERM GOAL #2   Title demo bilat hip abduction 5/5 to support body with running ( 03/31/16)    Status On-going  PT LONG TERM GOAL #3   Title report no pain in the Lt low back with daily activity ( 03/31/16)    Status On-going     PT LONG TERM GOAL #4   Title improve FOTO =/< 24% limited ( 03/31/16)    Status On-going               Plan - 03/11/16 1644    Clinical Impression Statement This is Wayne Newman's second visit, he was doing well until he fell.  This set him back and he was hypomobile in the lower lumbar vertebrea.  Had more sensitivity to DN today, good result after treatment.    Rehab Potential Excellent   PT Frequency 1x / week   PT Duration 4 weeks   PT Treatment/Interventions Moist Heat;Ultrasound;Therapeutic exercise;Dry needling;Manual techniques;Neuromuscular re-education;Cryotherapy;Electrical Stimulation;Patient/family education   PT Next Visit Plan progress to strengthening core and higher level SLS activities, manual work to back if muscles are still tight.    Consulted and Agree with Plan of Care Patient      Patient will benefit from  skilled therapeutic intervention in order to improve the following deficits and impairments:  Decreased range of motion, Pain, Increased muscle spasms, Decreased strength, Hypomobility  Visit Diagnosis: Chronic left-sided low back pain without sciatica  Muscle weakness (generalized)     Problem List Patient Active Problem List   Diagnosis Date Noted  . Eczema 04/23/2015  . Abdominal pain, epigastric 01/02/2015  . Mild sleep apnea 02/20/2013  . Fatigue 12/20/2012  . Elevated LFTs 09/29/2012  . ADD (attention deficit disorder) 08/24/2012  . Hypothyroid 08/24/2012    Wayne Newman PT  03/11/2016, 5:46 PM  [facility] [address] [place], [place], [address] Phone: [phone]   Fax:  [phone]  Name: Wayne Newman MRN: [id number] Date of Birth: May 05, 1975

## 2016-03-17 ENCOUNTER — Encounter: Payer: Self-pay | Admitting: Physical Therapy

## 2016-03-17 ENCOUNTER — Ambulatory Visit (INDEPENDENT_AMBULATORY_CARE_PROVIDER_SITE_OTHER): Payer: 59 | Admitting: Physical Therapy

## 2016-03-17 DIAGNOSIS — M6281 Muscle weakness (generalized): Secondary | ICD-10-CM | POA: Diagnosis not present

## 2016-03-17 DIAGNOSIS — M545 Low back pain, unspecified: Secondary | ICD-10-CM

## 2016-03-17 DIAGNOSIS — G8929 Other chronic pain: Secondary | ICD-10-CM

## 2016-03-17 NOTE — Patient Instructions (Signed)
Pelvic Press     Place hands under belly between navel and pubic bone, palms up. Feel pressure on hands. Increase pressure on hands by pressing pelvis down. This is NOT a pelvic tilt. Hold __5_ seconds. Relax. Repeat _10__ times. Once a day.  KNEE: Flexion - Prone - hands under hips   Hold pelvic press. Bend knee, then return the foot down. Repeat on opposite leg. Do not raise hips. _10__ reps per set. When this is mastered, pull both heels up at same time, x 10 reps.  Once a day   Leg Lift: One-Leg   Press pelvis down. Keep knee straight; lengthen and lift one leg (from waist). Do not twist body. Keep other leg down. Hold _1__ seconds. Relax. Repeat 10 time. Repeat with other leg.  HIP: Extension / KNEE: Flexion - Prone - hands under hips     Hold pelvic press. Bend knee, squeeze glutes. Raise leg up  10___ reps per set, _1__ sets per day, _1__ time a day.   Axial Extension- Upper body sequence * always start with pelvic press    Lie on stomach with forehead resting on floor and arms at sides. Tuck chin in and raise head from floor without bending it up or down. Repeat ___10_ times per set. Do __1__ sets per session. Do _1___ sessions per day.  Progression:  Arms at side Arms in T shape Arms in W shape  Arms in M shape Arms in Y shape  [facility] [address] [place], [place] [address]  [phone] (office) [phone] (fax)

## 2016-03-17 NOTE — Therapy (Signed)
[place], [place], [address] Phone: [phone]   Fax:  [phone]  Physical Therapy Treatment  Patient Details  Name: Wayne Newman MRN: [id number] Date of Birth: 1975-05-11 Referring Provider: Dr Sheppard Coil   Encounter Date: 03/17/2016      PT End of Session - 03/17/16 0715    Visit Number 3   Number of Visits 4   Date for PT Re-Evaluation 03/31/16   PT Start Time 0715   PT Stop Time 0800   PT Time Calculation (min) 45 min   Activity Tolerance Patient tolerated treatment well      Past Medical History:  Diagnosis Date  . Thyroid disease     History reviewed. No pertinent surgical history.  There were no vitals filed for this visit.      Subjective Assessment - 03/17/16 0715    Subjective Wayne Newman reports still having tenderness in the low back area   Currently in Pain? Yes   Pain Score 3    Pain Location Back   Pain Orientation Left;Lower   Pain Descriptors / Indicators Aching   Pain Type Chronic pain   Pain Onset More than a month ago   Pain Frequency Constant   Aggravating Factors  nothing makes it worse - stays constant   Pain Relieving Factors TENs                          [facility] Adult PT Treatment/Exercise - 03/17/16 0001      Exercises   Exercises Lumbar     Lumbar Exercises: Stretches   Single Knee to Chest Stretch 10 seconds   Double Knee to Chest Stretch 30 seconds   Quadruped Mid Back Stretch 30 seconds  childs pose     Lumbar Exercises: Supine   Other Supine Lumbar Exercises 10 reps pilates table top with heel taps, leg circles CW/CCW  10 each   Other Supine Lumbar Exercises hip flexor stretch     Lumbar Exercises: Prone   Other Prone Lumbar Exercises 10 reps pelvic press series, lower and upper body                PT Education - 03/17/16 0727    Education provided Yes   Education Details pelvic press routine   Person(s) Educated  Patient   Methods Explanation;Demonstration;Handout   Comprehension Returned demonstration;Verbalized understanding             PT Long Term Goals - 03/17/16 0802      PT LONG TERM GOAL #1   Title I with advanced HEP to include running without back brace ( 03/31/16)    Status On-going     PT LONG TERM GOAL #2   Title demo bilat hip abduction 5/5 to support body with running ( 03/31/16)      PT LONG TERM GOAL #3   Title report no pain in the Lt low back with daily activity ( 03/31/16)    Status On-going     PT LONG TERM GOAL #4   Title improve FOTO =/< 24% limited ( 03/31/16)      PT LONG TERM GOAL #5   Status On-going               Plan - 03/17/16 0801    Clinical Impression Statement Wayne Newman tolerated strengthening well, had decrease in back discomfort afterwards. Did require VCs for form especially as he fatigued.  No goal met.  Rehab Potential Excellent   PT Frequency 1x / week   PT Duration 4 weeks   PT Treatment/Interventions Moist Heat;Ultrasound;Therapeutic exercise;Dry needling;Manual techniques;Neuromuscular re-education;Cryotherapy;Electrical Stimulation;Patient/family education   PT Next Visit Plan FOTO, may need a couple more visits, add in abdominal ex and hip if needed.    Consulted and Agree with Plan of Care Patient      Patient will benefit from skilled therapeutic intervention in order to improve the following deficits and impairments:  Decreased range of motion, Pain, Increased muscle spasms, Decreased strength, Hypomobility  Visit Diagnosis: Chronic left-sided low back pain without sciatica  Muscle weakness (generalized)     Problem List Patient Active Problem List   Diagnosis Date Noted  . Eczema 04/23/2015  . Abdominal pain, epigastric 01/02/2015  . Mild sleep apnea 02/20/2013  . Fatigue 12/20/2012  . Elevated LFTs 09/29/2012  . ADD (attention deficit disorder) 08/24/2012  . Hypothyroid 08/24/2012    Boneta Lucks rPT 03/17/2016,  8:04 AM  [facility] [place], [place], [address] Phone: [phone]   Fax:  [phone]  Name: Wayne Newman MRN: [id number] Date of Birth: [birth year]

## 2016-03-22 ENCOUNTER — Ambulatory Visit (INDEPENDENT_AMBULATORY_CARE_PROVIDER_SITE_OTHER): Payer: 59 | Admitting: Physical Therapy

## 2016-03-22 DIAGNOSIS — M545 Low back pain, unspecified: Secondary | ICD-10-CM

## 2016-03-22 DIAGNOSIS — M6281 Muscle weakness (generalized): Secondary | ICD-10-CM

## 2016-03-22 DIAGNOSIS — G8929 Other chronic pain: Secondary | ICD-10-CM | POA: Diagnosis not present

## 2016-03-22 NOTE — Therapy (Addendum)
[facility] [address] [place], [place], [address] Phone: [phone]   Fax:  [phone]  Physical Therapy Treatment  Patient Details  Name: Wayne Newman MRN: [id number] Date of Birth: Dec 07, 1975 Referring Provider: Dr. Lyn HollingsheadAlexander  Encounter Date: 03/22/2016      PT End of Session - 03/22/16 0719    Visit Number 4   Number of Visits 4   Date for PT Re-Evaluation 03/31/16   PT Start Time 0718   PT Stop Time 0809   PT Time Calculation (min) 51 min   Activity Tolerance Patient tolerated treatment well;No increased pain      Past Medical History:  Diagnosis Date  . Thyroid disease     No past surgical history on file.  There were no vitals filed for this visit.      Subjective Assessment - 03/22/16 0719    Subjective He reports his pain has remained unchanged since initial visit.  He always feels better after estim, and has been using TENS at home. He feels like he is still recovering from his fall.  Has not run any in last 2 wks.    Patient Stated Goals Returning to exercise - running, lifting free weights, DIY stuff,    Currently in Pain? Yes   Pain Score 2    Pain Location Back   Pain Orientation Left;Lower   Pain Descriptors / Indicators Aching   Pain Radiating Towards SI jt area    Aggravating Factors  sitting in office chair   Pain Relieving Factors TENS            [facility] PT Assessment - 03/22/16 0001      Assessment   Medical Diagnosis Lt sided chronic LBP   Referring Provider Dr. Lyn HollingsheadAlexander   Onset Date/Surgical Date 02/04/16   Hand Dominance Left   Next MD Visit PRN     Strength   Right Hip ABduction --  5-/5   Left Hip ABduction --  5-/5     Palpation   Palpation comment tender in Rt/Lt PSIS.  Lt ASIS higher than Rt; Lt PSIS lower than Rt.                       [facility] Adult PT Treatment/Exercise - 03/22/16 0001      Lumbar Exercises: Stretches   Passive Hamstring  Stretch 2 reps;30 seconds   Passive Hamstring Stretch Limitations shown seated version for work    Single Knee to Chest Stretch 10 seconds   Double Knee to Chest Stretch 30 seconds   Quadruped Mid Back Stretch 30 seconds  childs pose   Quadruped Mid Back Stretch Limitations with lateral trunk flexion.    Piriformis Stretch 2 reps;20 seconds   Piriformis Stretch Limitations shown seated version for work.      Moist Heat Therapy   Number Minutes Moist Heat 15 Minutes   Moist Heat Location Lumbar Spine     Electrical Stimulation   Electrical Stimulation Location bilat SI and lumbar paraspinals.   Electrical Stimulation Action IFC   Electrical Stimulation Parameters to tolerance    Electrical Stimulation Goals Pain     Manual Therapy   Manual Therapy Muscle Energy Technique;Taping   Muscle Energy Technique Lt quad contraction/ then press up to assist in rotating Lt ilium anteriorly.    Kinesiotex Facilitate Muscle     Kinesiotix   Facilitate Muscle  2- I strips applied to lumbar paraspinals to increase propioception /  increase awareness of posture.                 PT Education - 03/22/16 0840    Education provided Yes   Education Details Dynamic tape rationale and removal instructions.  Encouraged use of lumbar support in office chair.    Person(s) Educated Patient   Methods Explanation   Comprehension Verbalized understanding             PT Long Term Goals - 03/22/16 0731      PT LONG TERM GOAL #1   Title I with advanced HEP to include running without back brace (04/19/16)    Time 4   Period Weeks   Status On-going     PT LONG TERM GOAL #2   Title demo bilat hip abduction 5/5 to support body with running (04/19/16)    Time 4   Period Weeks   Status On-going     PT LONG TERM GOAL #3   Title report no pain in the Lt low back with daily activity (04/19/16)    Time 4   Period Weeks   Status On-going     PT LONG TERM GOAL #4   Title improve FOTO =/< 24%  limited (04/19/16)    Time 4   Period Weeks   Status On-going               Plan - 03/22/16 0800    Clinical Impression Statement Pt had Lt posteriorly rotated ilium; improved alignment and decreased reported pain with muscle energy and estim/MHP.  Pt is progressing with hip strength.  LE remain tight; added piriformis and hamstring stretches to HEP.  Pt is making gradual progress towards goals and is interested in continuation of therapy.    Addendum [facility]): Pt is slowly progressing towards LTGs continued tightness and noted rotated ilium today.  Recommend continued PT 1x/wk x 4 wks to address deficits and continue towards LTGs.   Rehab Potential Excellent   PT Frequency 1x / week   PT Duration 4 weeks   PT Treatment/Interventions Moist Heat;Ultrasound;Therapeutic exercise;Dry needling;Manual techniques;Neuromuscular re-education;Cryotherapy;Electrical Stimulation;Patient/family education   PT Next Visit Plan Spoke to supervising PT regarding progress; will request additonal visits from MD.  Next visit will assess response to muscle energy, and tape.     Consulted and Agree with Plan of Care Patient      Patient will benefit from skilled therapeutic intervention in order to improve the following deficits and impairments:  Decreased range of motion, Pain, Increased muscle spasms, Decreased strength, Hypomobility  Visit Diagnosis: Chronic left-sided low back pain without sciatica - Plan: PT plan of care cert/re-cert  Muscle weakness (generalized) - Plan: PT plan of care cert/re-cert     Problem List Patient Active Problem List   Diagnosis Date Noted  . Eczema 04/23/2015  . Abdominal pain, epigastric 01/02/2015  . Mild sleep apnea 02/20/2013  . Fatigue 12/20/2012  . Elevated LFTs 09/29/2012  . ADD (attention deficit disorder) 08/24/2012  . Hypothyroid 08/24/2012           Mayer Camel, PTA 03/22/16 9:03 AM    Clarita Crane, PT, DPT 03/22/16 9:04  AM     [facility] [address] [place], [place], [address] Phone: [phone]   Fax:  [phone]  Name: Wayne Newman MRN: [id number] Date of Birth: June 22, 1975

## 2016-03-29 ENCOUNTER — Ambulatory Visit (INDEPENDENT_AMBULATORY_CARE_PROVIDER_SITE_OTHER): Payer: 59 | Admitting: Physical Therapy

## 2016-03-29 DIAGNOSIS — M545 Low back pain, unspecified: Secondary | ICD-10-CM

## 2016-03-29 DIAGNOSIS — M6281 Muscle weakness (generalized): Secondary | ICD-10-CM

## 2016-03-29 DIAGNOSIS — G8929 Other chronic pain: Secondary | ICD-10-CM | POA: Diagnosis not present

## 2016-03-29 NOTE — Therapy (Signed)
[facility] [address] [place] [address] [place], [place], [address] Phone: [phone]   Fax:  [phone]  Physical Therapy Treatment  Patient Details  Name: Wayne Newman MRN: [id number] Date of Birth: [birth year] Referring Provider: Dr. Lyn HollingsheadAlexander.   Encounter Date: 03/29/2016      PT End of Session - 03/29/16 0717    Visit Number 5   Number of Visits 8   Date for PT Re-Evaluation 04/19/16   PT Start Time 0715   PT Stop Time 0814   PT Time Calculation (min) 59 min   Activity Tolerance Patient tolerated treatment well;No increased pain   Behavior During Therapy WFL for tasks assessed/performed      Past Medical History:  Diagnosis Date  . Thyroid disease     No past surgical history on file.  There were no vitals filed for this visit.      Subjective Assessment - 03/29/16 0718    Subjective Pt reports the dynamic tape fell off by end of the day last week.  He has a pain free day on Tuesday.  The pain started to creep back by end of week.  He thinks his mattress is a contributing factor. He still has not return to running and has limited the amount of weight he lifts.  He is performing HEP daily.    Patient Stated Goals Returning to exercise - running, lifting free weights, DIY stuff,    Currently in Pain? Yes   Pain Score 3    Pain Location Back   Pain Orientation Lower   Pain Descriptors / Indicators Tender   Aggravating Factors  sitting in office chair   Pain Relieving Factors TENS            [facility] PT Assessment - 03/29/16 0001      Assessment   Medical Diagnosis Lt sided chronic LBP   Referring Provider Dr. Lyn HollingsheadAlexander.    Onset Date/Surgical Date 02/04/16   Hand Dominance Left   Next MD Visit PRN     Palpation   Palpation comment tender in Rt/Lt PSIS.  Lt ASIS higher than Rt; Lt PSIS lower than Rt.           [facility] Adult PT Treatment/Exercise - 03/29/16 0001      Lumbar Exercises: Stretches   Passive  Hamstring Stretch 2 reps;20 seconds   Single Knee to Chest Stretch 10 seconds   Double Knee to Chest Stretch 30 seconds   Piriformis Stretch 2 reps;20 seconds   Piriformis Stretch Limitations (1 rep sitting, 1 rep supine) - pt reported some discomfort in Lt SI jt when obtaining Lt side stretch.     Lumbar Exercises: Prone   Other Prone Lumbar Exercises pelvic press x 5 reps, repeated with bilat knee flex/ext x 5, hip ext with knee flex x 5 reps, hip ext knee straight x 5 each leg, goal post x 5, Y's x 5     Modalities   Modalities Electrical Stimulation;Ultrasound     Electrical Stimulation   Electrical Stimulation Location Lt lumbar paraspinals, Rt glute med/SI joint   Electrical Stimulation Action COMBO US   Electrical Stimulation Parameters to tolerance   Electrical Stimulation Goals Pain     Ultrasound   Ultrasound Location see estim   Ultrasound Parameters 100%, 1.2 w/cm2, 9 min total.    Ultrasound Goals Pain;Other (Comment)  tightness.      Manual Therapy   Manual Therapy Muscle Energy Technique;Myofascial release;Soft tissue mobilization   Soft  tissue mobilization TPR to paraspinals at L3.    Myofascial Release to Rt glute med, QL.  to Lt QL.    Muscle Energy Technique Lt quad contraction/ then press up to assist in rotating Lt ilium anteriorly.    Kinesiotex Facilitate Muscle     Kinesiotix   Facilitate Muscle  2- I strips of Rock Tape applied to lumbar paraspinals to increase propioception / increase awareness of posture.   (skin prepped with alcohol prior to tape application)                     PT Long Term Goals - 03/22/16 0731      PT LONG TERM GOAL #1   Title I with advanced HEP to include running without back brace (04/19/16)    Time 4   Period Weeks   Status On-going     PT LONG TERM GOAL #2   Title demo bilat hip abduction 5/5 to support body with running (04/19/16)    Time 4   Period Weeks   Status On-going     PT LONG TERM GOAL #3    Title report no pain in the Lt low back with daily activity (04/19/16)    Time 4   Period Weeks   Status On-going     PT LONG TERM GOAL #4   Title improve FOTO =/< 24% limited (04/19/16)    Time 4   Period Weeks   Status On-going               Plan - 03/29/16 0820    Clinical Impression Statement Pt had postive response to last session, reporting a painfree day following therapy.  Lt ilium posteriorly rotated but not as drastic as last session; improved alignment with muscle energy. Pt's awareness has increased regarding increased symptoms with post pelvic tilt when sitting or bending down without bending at hips.  Pt reported elimination of pain at end of session.     Rehab Potential Excellent   PT Frequency 1x / week   PT Duration 4 weeks   PT Treatment/Interventions Moist Heat;Ultrasound;Therapeutic exercise;Dry needling;Manual techniques;Neuromuscular re-education;Cryotherapy;Electrical Stimulation;Patient/family education   PT Next Visit Plan Manual therapy, including spinal mobilizations (if indicated) from supervising PT.  Cont core strengthening and postural awareness.    Consulted and Agree with Plan of Care Patient      Patient will benefit from skilled therapeutic intervention in order to improve the following deficits and impairments:  Decreased range of motion, Pain, Increased muscle spasms, Decreased strength, Hypomobility  Visit Diagnosis: Chronic left-sided low back pain without sciatica  Muscle weakness (generalized)     Problem List Patient Active Problem List   Diagnosis Date Noted  . Eczema 04/23/2015  . Abdominal pain, epigastric 01/02/2015  . Mild sleep apnea 02/20/2013  . Fatigue 12/20/2012  . Elevated LFTs 09/29/2012  . ADD (attention deficit disorder) 08/24/2012  . Hypothyroid 08/24/2012   Mayer Camel, PTA 03/29/16 8:27 AM  [facility] [place] [address] [place], [place],  [address] Phone: [phone]   Fax:  [phone]  Name: Wayne Newman MRN: [id number] Date of Birth: 1975/11/09

## 2016-04-07 ENCOUNTER — Ambulatory Visit (INDEPENDENT_AMBULATORY_CARE_PROVIDER_SITE_OTHER): Payer: 59 | Admitting: Physical Therapy

## 2016-04-07 DIAGNOSIS — M545 Low back pain, unspecified: Secondary | ICD-10-CM

## 2016-04-07 DIAGNOSIS — G8929 Other chronic pain: Secondary | ICD-10-CM | POA: Diagnosis not present

## 2016-04-07 DIAGNOSIS — M6281 Muscle weakness (generalized): Secondary | ICD-10-CM | POA: Diagnosis not present

## 2016-04-07 NOTE — Therapy (Signed)
[facility] [address] [place], [place], [address] Phone: [phone]   Fax:  [phone]  Physical Therapy Treatment  Patient Details  Name: Wayne Newman MRN: [id number] Date of Birth: 13-Feb-1975 Referring Provider: Dr. Lyn HollingsheadAlexander.   Encounter Date: 04/07/2016      PT End of Session - 04/07/16 0727    Visit Number 6   Number of Visits 8   Date for PT Re-Evaluation 04/19/16   PT Start Time 0718   PT Stop Time 0808   PT Time Calculation (min) 50 min   Activity Tolerance Patient tolerated treatment well;No increased pain   Behavior During Therapy WFL for tasks assessed/performed      Past Medical History:  Diagnosis Date  . Thyroid disease     No past surgical history on file.  There were no vitals filed for this visit.      Subjective Assessment - 04/07/16 0727    Subjective Pt reports the Rock tape stayed on 1wk, gave good feedback to posture.  He had 3 good days of less pain after last session, but pain creeped back.  He flipped his mattress and wonders if he slept wrong.     Currently in Pain? Yes   Pain Score 0-No pain  up to 4/10 during transitions.  0/10 in standing    Pain Location Back            [facility] PT Assessment - 04/07/16 0001      Palpation   Palpation comment Tender L2-L5 with CPA mobs grade II-III,  UPA grade III tenderness L4-5.  No tenderness at SI joint. (assessed by Corlis Leakelyn Holt, PT)          [facility] PT Treatment/Exercise - 04/07/16 0001      Self-Care   Self-Care Posture   Posture Pt educated on spine/disc protection with good posture and body mechanics.  Pt verbalized understanding.      Lumbar Exercises: Stretches   Press Ups --  10 reps, 2-3 sec hold with overpressure to sacrum from PT.      Lumbar Exercises: Standing   Other Standing Lumbar Exercises Hip flexor stretch with core engaged x 20 sec      Lumbar Exercises: Seated   Sit to Stand Limitations Seated hip  flexor stretch with leg off side of chair x 30 sec each leg.      Lumbar Exercises: Supine   Ab Set 10 reps  3 part core. 5 sec hold.      Modalities   Modalities Electrical Stimulation;Moist Heat     Moist Heat Therapy   Number Minutes Moist Heat 15 Minutes   Moist Heat Location Lumbar Spine     Electrical Stimulation   Electrical Stimulation Location bilat lumbar paraspinals   Electrical Stimulation Action IFC   Electrical Stimulation Parameters to tolerance    Electrical Stimulation Goals Pain;Tone     Manual Therapy   Joint Mobilization CPA L2-5 grade II-III, UPA L4-5 grade III Rt and Lt.   provided by Corlis Leakelyn Holt, PT   Myofascial Release to bilat      Kinesiotix   Facilitate Muscle  2- I strips of Rock Tape applied to lumbar paraspinals to increase propioception / increase awareness of posture.   (skin prepped with alcohol prior to tape application)                PT Education - 04/07/16 0755    Education provided Yes   Education Details  HEP    Person(s) Educated Patient   Methods Explanation;Demonstration;Tactile cues;Handout   Comprehension Verbalized understanding;Returned demonstration             PT Long Term Goals - 03/22/16 0731      PT LONG TERM GOAL #1   Title I with advanced HEP to include running without back brace (04/19/16)    Time 4   Period Weeks   Status On-going     PT LONG TERM GOAL #2   Title demo bilat hip abduction 5/5 to support body with running (04/19/16)    Time 4   Period Weeks   Status On-going     PT LONG TERM GOAL #3   Title report no pain in the Lt low back with daily activity (04/19/16)    Time 4   Period Weeks   Status On-going     PT LONG TERM GOAL #4   Title improve FOTO =/< 24% limited (04/19/16)    Time 4   Period Weeks   Status On-going               Plan - 04/07/16 0753    Clinical Impression Statement Pt had decreased tightness / tenderness in lumbar spine after joint mobilizations provided by  supervising PT, Wayne Holt during session.  Session was focused on educating pt on lumbar ext, body mechanics and 3-part core.  Tape reapplied to improve proprioception/ reinforce good body mechanics.    Rehab Potential Excellent   PT Frequency 1x / week   PT Duration 4 weeks   PT Treatment/Interventions Moist Heat;Ultrasound;Therapeutic exercise;Dry needling;Manual techniques;Neuromuscular re-education;Cryotherapy;Electrical Stimulation;Patient/family education   PT Next Visit Plan Manual therapy, including spinal mobilizations (if indicated) from PT.  Cont core strengthening and postural awareness.    Consulted and Agree with Plan of Care Patient      Patient will benefit from skilled therapeutic intervention in order to improve the following deficits and impairments:  Decreased range of motion, Pain, Increased muscle spasms, Decreased strength, Hypomobility  Visit Diagnosis: Chronic left-sided low back pain without sciatica  Muscle weakness (generalized)     Problem List Patient Active Problem List   Diagnosis Date Noted  . Eczema 04/23/2015  . Abdominal pain, epigastric 01/02/2015  . Mild sleep apnea 02/20/2013  . Fatigue 12/20/2012  . Elevated LFTs 09/29/2012  . ADD (attention deficit disorder) 08/24/2012  . Hypothyroid 08/24/2012   Wayne Newman, Wayne Newman 04/07/16 7:56 AM   Wayne Newman PT, MPH 04/07/16 8:02 AM    [facility] [address] [place], [place], [address] Phone: [phone]   Fax:  [phone]  Name: Wayne Newman MRN: [id number] Date of Birth: 13-Apr-1975

## 2016-04-07 NOTE — Patient Instructions (Addendum)
Therapeutic - Prone Press-Up    Push up so chest is off floor and back is arched. Do not raise hips. Hold _2-3___ seconds. Return to prone position. Repeat _10___ times.  Trunk Extension    Standing, place back of open hands on low back. Straighten spine then arch the back and move shoulders back. Repeat __2-3__ times per session. Do every day after any prolonged sitting.  Variation: Seated  3 part core exercise    With neutral spine, tighten pelvic floor, then tighten abdominal muscles sucking your belly button to back bone, then tighten muscles in low back at waist. Hold 10 seconds. Repeat _10_ times. Do _several__ times a day.  * When you have mastered this, you can contract all 3 muscle groups at same time.   * Progress to do this activity sitting, standing, walking and functional activities.   Sleeping on Back  Place pillow under knees. A pillow with cervical support and a roll around waist are also helpful. Copyright  VHI. All rights reserved.  Sleeping on Side Place pillow between knees. Use cervical support under neck and a roll around waist as needed. Copyright  VHI. All rights reserved.   Sleeping on Stomach   If this is the only desirable sleeping position, place pillow under lower legs, and under stomach or chest as needed.  Posture - Sitting   Sit upright, head facing forward. Try using a roll to support lower back. Keep shoulders relaxed, and avoid rounded back. Keep hips level with knees. Avoid crossing legs for long periods. Stand to Sit / Sit to Stand   To sit: Bend knees to lower self onto front edge of chair, then scoot back on seat. To stand: Reverse sequence by placing one foot forward, and scoot to front of seat. Use rocking motion to stand up.   Work Height and Reach  Ideal work height is no more than 2 to 4 inches below elbow level when standing, and at elbow level when sitting. Reaching should be limited to arm's length, with elbows slightly  bent.  Bending  Bend at hips and knees, not back. Keep feet shoulder-width apart.    Posture - Standing   Good posture is important. Avoid slouching and forward head thrust. Maintain curve in low back and align ears over shoul- ders, hips over ankles.  Alternating Positions   Alternate tasks and change positions frequently to reduce fatigue and muscle tension. Take rest breaks. Computer Work   Position work to Art gallery manager. Use proper work and seat height. Keep shoulders back and down, wrists straight, and elbows at right angles. Use chair that provides full back support. Add footrest and lumbar roll as needed.  Getting Into / Out of Car  Lower self onto seat, scoot back, then bring in one leg at a time. Reverse sequence to get out.  Dressing  Lie on back to pull socks or slacks over feet, or sit and bend leg while keeping back straight.    Housework - Sink  Place one foot on ledge of cabinet under sink when standing at sink for prolonged periods.   Pushing / Pulling  Pushing is preferable to pulling. Keep back in proper alignment, and use leg muscles to do the work.  Deep Squat   Squat and lift with both arms held against upper trunk. Tighten stomach muscles without holding breath. Use smooth movements to avoid jerking.  Avoid Twisting   Avoid twisting or bending back. Pivot around using foot movements, and  bend at knees if needed when reaching for articles.  Carrying Luggage   Distribute weight evenly on both sides. Use a cart whenever possible. Do not twist trunk. Move body as a unit.   Lifting Principles .Maintain proper posture and head alignment. .Slide object as close as possible before lifting. .Move obstacles out of the way. .Test before lifting; ask for help if too heavy. .Tighten stomach muscles without holding breath. .Use smooth movements; do not jerk. .Use legs to do the work, and pivot with feet. .Distribute the work load symmetrically and  close to the center of trunk. .Push instead of pull whenever possible.   Ask For Help   Ask for help and delegate to others when possible. Coordinate your movements when lifting together, and maintain the low back curve.  Log Roll   Lying on back, bend left knee and place left arm across chest. Roll all in one movement to the right. Reverse to roll to the left. Always move as one unit. Housework - Sweeping  Use long-handled equipment to avoid stooping.   Housework - Wiping  Position yourself as close as possible to reach work surface. Avoid straining your back.  Laundry - Unloading Wash   To unload small items at bottom of washer, lift leg opposite to arm being used to reach.  Gardening - Raking  Move close to area to be raked. Use arm movements to do the work. Keep back straight and avoid twisting.     Cart  When reaching into cart with one arm, lift opposite leg to keep back straight.   Getting Into / Out of Bed  Lower self to lie down on one side by raising legs and lowering head at the same time. Use arms to assist moving without twisting. Bend both knees to roll onto back if desired. To sit up, start from lying on side, and use same move-ments in reverse. Housework - Vacuuming  Hold the vacuum with arm held at side. Step back and forth to move it, keeping head up. Avoid twisting.   Laundry - Armed forces training and education officerLoading Wash  Position laundry basket so that bending and twisting can be avoided.   Laundry - Unloading Dryer  Squat down to reach into clothes dryer or use a reacher.  Gardening - Weeding / Psychiatric nurselanting  Squat or Kneel. Knee pads may be helpful.                     [facility] [address] [place], [place] [address]  [phone] ([phone] (fax)

## 2016-04-21 ENCOUNTER — Encounter: Payer: Self-pay | Admitting: Physical Therapy

## 2016-04-21 ENCOUNTER — Ambulatory Visit (INDEPENDENT_AMBULATORY_CARE_PROVIDER_SITE_OTHER): Payer: 59 | Admitting: Physical Therapy

## 2016-04-21 DIAGNOSIS — M545 Low back pain, unspecified: Secondary | ICD-10-CM

## 2016-04-21 DIAGNOSIS — M6281 Muscle weakness (generalized): Secondary | ICD-10-CM | POA: Diagnosis not present

## 2016-04-21 DIAGNOSIS — G8929 Other chronic pain: Secondary | ICD-10-CM | POA: Diagnosis not present

## 2016-04-21 NOTE — Therapy (Addendum)
[place] [address] [place], [place], [address] Phone: [phone]   Fax:  [phone]  Physical Therapy Treatment  Patient Details  Name: Wayne Newman MRN: [id number] Date of Birth: Aug 11, 1975 Referring Provider: Dr Sheppard Coil  Encounter Date: 04/21/2016      PT End of Session - 04/21/16 0720    Visit Number 7   PT Start Time 0720   PT Stop Time 0801   PT Time Calculation (min) 41 min      Past Medical History:  Diagnosis Date  . Thyroid disease     History reviewed. No pertinent surgical history.  There were no vitals filed for this visit.      Subjective Assessment - 04/21/16 0725    Subjective Pt has been out of town for work, has done his HEP every other day.    Patient Stated Goals Returning to exercise - running, lifting free weights, DIY stuff,    Currently in Pain? No/denies  when he has pain it is between a 1-2/10, with lifitng.             [facility] PT Assessment - 04/21/16 0001      Assessment   Medical Diagnosis Lt sided chronic LBP   Referring Provider Dr Sheppard Coil   Onset Date/Surgical Date 02/04/16   Hand Dominance Left     Observation/Other Assessments   Focus on Therapeutic Outcomes (FOTO)  35% limited                     [facility] Adult PT Treatment/Exercise - 04/21/16 0001      Lumbar Exercises: Aerobic   Tread Mill 6 min, slowly increased speed to walking      Lumbar Exercises: Machines for Strengthening   Cybex Knee Extension 2x10, 6 plates   Cybex Knee Flexion 2x10, 2 plates   Leg Press 0H68, 12 plates     Lumbar Exercises: Standing   Other Standing Lumbar Exercises BWD lunges on sliders, holding 20#   Other Standing Lumbar Exercises side lunges and squats with focus on proper form and core engagement     Lumbar Exercises: Supine   Other Supine Lumbar Exercises pilates 100's and table tops with heel taps.                      PT Long Term  Goals - 04/21/16 0805      PT LONG TERM GOAL #1   Title I with advanced HEP to include running without back brace (04/19/16)    Status Partially Met  hasn't attempted running yet     PT LONG TERM GOAL #2   Title demo bilat hip abduction 5/5 to support body with running (04/19/16)    Status Achieved     PT LONG TERM GOAL #3   Title report no pain in the Lt low back with daily activity (04/19/16)    Status Partially Met  intermittent pain now and only up tp 1-2/10     PT LONG TERM GOAL #4   Title improve FOTO =/< 24% limited (04/19/16)    Status Not Met  35% limited               Plan - 04/21/16 0816    Clinical Impression Statement [place] has had decreased back pain, he hasn't returned to running and all his prior exercise. He did well with ther ex today however required cues for safe form and he fatigued with core  work.    Dispensing optician   PT Frequency 1x / week   PT Duration 4 weeks   PT Treatment/Interventions Moist Heat;Ultrasound;Therapeutic exercise;Dry needling;Manual techniques;Neuromuscular re-education;Cryotherapy;Electrical Stimulation;Patient/family education   PT Next Visit Plan place patient on hold for 2 weeks so he can progress to prior HEP, if no difficulties we will discharge in 2 wks, if difficulties he will return for treatment.    Consulted and Agree with Plan of Care Patient      Patient will benefit from skilled therapeutic intervention in order to improve the following deficits and impairments:  Decreased range of motion, Pain, Increased muscle spasms, Decreased strength, Hypomobility  Visit Diagnosis: Chronic left-sided low back pain without sciatica  Muscle weakness (generalized)     Problem List Patient Active Problem List   Diagnosis Date Noted  . Eczema 04/23/2015  . Abdominal pain, epigastric 01/02/2015  . Mild sleep apnea 02/20/2013  . Fatigue 12/20/2012  . Elevated LFTs 09/29/2012  . ADD (attention deficit disorder) 08/24/2012   . Hypothyroid 08/24/2012    Jeral Pinch PT  04/21/2016, 8:18 AM  [facility] [place], [place], [address] Phone: [phone]   Fax:  [phone]  Name: Crist Kruszka MRN: [id number] Date of Birth: 05/02/1975   PHYSICAL THERAPY DISCHARGE SUMMARY  Visits from Start of Care: 7  Current functional level related to goals / functional outcomes: See above, he was transitioning to gym program and return to running, was placed on hold to see how he did and call if he had problems.   Remaining deficits: none   Education / Equipment: HEP Plan: Patient agrees to discharge.  Patient goals were partially met. Patient is being discharged due to being pleased with the current functional level.  ?????   Jeral Pinch, PT 06/16/16 3:42 PM

## 2016-04-21 NOTE — Patient Instructions (Addendum)
Toe Touch    Lie on back, legs folded to chest, arms by sides. Exhale, lowering leg to just touch toes to mat. Inhale, returning knee to chest. Keep abdominals flat, navel to spine. Repeat __10__ times, alternating legs. Do _1___ sessions per day.  The Hundred    Lie on back, legs up, bent, arms toward ceiling. Exhale, pressing arms down to sides, curling up head and upper torso. Hold. Pump arms in small flutters up and down. _5___ pumps per inhale, __5__ pumps per exhale. Repeat _1-2___ times. Do __1__ sessions per day.  Copyright  VHI. All rights reserved.

## 2016-05-17 ENCOUNTER — Encounter: Payer: Self-pay | Admitting: Osteopathic Medicine

## 2016-05-17 ENCOUNTER — Ambulatory Visit (INDEPENDENT_AMBULATORY_CARE_PROVIDER_SITE_OTHER): Payer: 59 | Admitting: Osteopathic Medicine

## 2016-05-17 VITALS — BP 109/68 | HR 75 | Ht 72.0 in | Wt 190.0 lb

## 2016-05-17 DIAGNOSIS — F988 Other specified behavioral and emotional disorders with onset usually occurring in childhood and adolescence: Secondary | ICD-10-CM | POA: Diagnosis not present

## 2016-05-17 DIAGNOSIS — E039 Hypothyroidism, unspecified: Secondary | ICD-10-CM

## 2016-05-17 DIAGNOSIS — Z0000 Encounter for general adult medical examination without abnormal findings: Secondary | ICD-10-CM | POA: Diagnosis not present

## 2016-05-17 LAB — TSH: TSH: 1.09 m[IU]/L (ref 0.40–4.50)

## 2016-05-17 NOTE — Progress Notes (Signed)
HPI: Wayne Newman is a 41 y.o. male  who presents to [name] today, 05/17/16,  for chief complaint of:  Chief Complaint  Patient presents with  . Annual Exam    ADHD: doing well on current medications. Not always taking these every day, has some left over. [place] controlled substance database reviewed filled 03/01/2016, 04/11/2016, prescriptions written at last visit 02/16/2016.   Thyroid: Taking medications as noted below. On high dose, monitoring TSH closely. Due for recheck  Annual physical: See below for review of preventive care  Past medical, surgical, social and family history reviewed: Patient Active Problem List   Diagnosis Date Noted  . Eczema 04/23/2015  . Abdominal pain, epigastric 01/02/2015  . Mild sleep apnea 02/20/2013  . Fatigue 12/20/2012  . Elevated LFTs 09/29/2012  . ADD (attention deficit disorder) 08/24/2012  . Hypothyroid 08/24/2012   No past surgical history on file. Social History  Substance Use Topics  . Smoking status: Never Smoker  . Smokeless tobacco: Never Used  . Alcohol use Yes   Family History  Problem Relation Age of Onset  . Alcohol abuse      grandfather  . Heart attack      grandfather  . Stroke      grandfather     Current medication list and allergy/intolerance information reviewed:   Current Outpatient Prescriptions  Medication Sig Dispense Refill  . amphetamine-dextroamphetamine (ADDERALL) 20 MG tablet Take 1.5 tablets (30 mg total) by mouth 2 (two) times daily. 90 tablet 0  . SYNTHROID 125 MCG tablet Take 2 tablets (250 mcg total) by mouth daily before breakfast. 180 tablet 1   No current facility-administered medications for this visit.    Allergies  Allergen Reactions  . Sulfa Antibiotics Other (See Comments)    Given for pink eye & irritated eye more.      Review of Systems:  Constitutional:  No  fever, no chills, No recent illness, No unintentional weight changes.  No significant fatigue.   HEENT: No  headache,  Cardiac: No  chest pain  Respiratory:  No  shortness of breath.  Gastrointestinal: No  abdominal pain, No  nausea  Musculoskeletal: No new myalgia/arthralgia - following with orthopedics for stress fracture  Skin: No  Rash  Endocrine: No cold intolerance,  No heat intolerance.  Psychiatric: No  concerns with depression, No  concerns with anxiety, No sleep problems, No mood problems  Exam:  BP 109/68   Pulse 75   Ht 6' (1.829 m)   Wt 190 lb (86.2 kg)   BMI 25.77 kg/m   Constitutional: VS see above. General Appearance: alert, well-developed, well-nourished, NAD  Eyes: Normal lids and conjunctive, non-icteric sclera  Ears, Nose, Mouth, Throat: MMM, Normal external inspection ears/nares/mouth/lips/gums. TM normal bilaterally. Pharynx/tonsils no erythema, no exudate. Nasal mucosa normal.   Neck: No masses, trachea midline. No thyroid enlargement. No tenderness/mass appreciated. No lymphadenopathy  Respiratory: Normal respiratory effort. no wheeze, no rhonchi, no rales  Cardiovascular: S1/S2 normal, no murmur, no rub/gallop auscultated. RRR. No lower extremity edema.   Gastrointestinal: Nontender, no masses. No hepatomegaly, no splenomegaly. No hernia appreciated. Bowel sounds normal. Rectal exam deferred.   Musculoskeletal: Gait normal. No clubbing/cyanosis of digits.   Neurological: Normal balance/coordination. No tremor.   Skin: warm, dry, intact.   Psychiatric: Normal judgment/insight. Normal mood and affect. Oriented x3.     Labs reviewed:  TSH 02/2016 WNL but big change from prior - planning to recheck  [facility] 02/2016  liver enzymes better    ASSESSMENT/PLAN:   Annual physical exam  Attention deficit disorder, unspecified hyperactivity presence - Has one prescription left over, when next due for refills, okay to do 3 at a time with follow-up appointment after those are run out   Hypothyroidism, unspecified type -  Labs today and refill if TSH in good range   MALE PREVENTIVE CARE  updated 05/17/16  ANNUAL SCREENING/COUNSELING  Any changes to health in the past year? no  Diet/Exercise - HEALTHY HABITS DISCUSSED TO DECREASE CV RISK History  Smoking Status  . Never Smoker  Smokeless Tobacco  . Never Used   History  Alcohol Use  . Yes  Average 1 per day  Depression screen [facility] 2/9 05/17/2016  Decreased Interest 0  Down, Depressed, Hopeless 0  PHQ - 2 Score 0    SEXUAL/REPRODUCTIVE HEALTH  STI testing needed/desired today? - no  Any concerns with testosterone/libido? - no  INFECTIOUS DISEASE SCREENING  HIV - does not need  GC/CT - does not need  HepC - does not need  TB - does not need  CANCER SCREENING  Lung - USPSTF: 55-80yo w/ 30 py hx unless quit w/in 4350yr - does not need  Colon - does not need  Prostate - does not need  OTHER DISEASE SCREENING  Lipid - does not need  DM2 - does not need  AAA - 65-75yo ever smoked: does not need  Osteoporosis - men 40yo+ - does not need  ADULT VACCINATION  Influenza - annual vaccine recommended  Td - booster every 10 years   Zoster - option at 1850, yes at 60+   PCV13 - was not indicated  PPSV23 - was not indicated Immunization History  Administered Date(s) Administered  . Influenza,inj,Quad PF,36+ Mos 09/29/2012, 01/31/2015, 02/16/2016  . Influenza-Unspecified 10/11/2008  . Td 01/12/1995  . Tdap 01/31/2008     Visit summary with medication list and pertinent instructions was printed for patient to review. All questions at time of visit were answered - patient instructed to contact office with any additional concerns. ER/RTC precautions were reviewed with the patient. Follow-up plan: Return for ADHD in 4 months or sooner if needed.

## 2016-05-18 ENCOUNTER — Other Ambulatory Visit: Payer: Self-pay | Admitting: Osteopathic Medicine

## 2016-05-18 MED ORDER — SYNTHROID 125 MCG PO TABS: 250.0000 ug | ORAL_TABLET | Freq: Every day | ORAL | 1 refills | Status: DC

## 2016-06-13 ENCOUNTER — Other Ambulatory Visit: Payer: Self-pay | Admitting: Osteopathic Medicine

## 2016-06-15 ENCOUNTER — Other Ambulatory Visit: Payer: Self-pay

## 2016-06-15 DIAGNOSIS — F988 Other specified behavioral and emotional disorders with onset usually occurring in childhood and adolescence: Secondary | ICD-10-CM

## 2016-06-15 MED ORDER — AMPHETAMINE-DEXTROAMPHETAMINE 20 MG PO TABS: 20.0000 mg | ORAL_TABLET | Freq: Two times a day (BID) | ORAL | 0 refills | Status: DC

## 2016-06-15 MED ORDER — AMPHETAMINE-DEXTROAMPHETAMINE 20 MG PO TABS: 30.0000 mg | ORAL_TABLET | Freq: Two times a day (BID) | ORAL | 0 refills | Status: DC

## 2016-10-01 ENCOUNTER — Telehealth: Payer: Self-pay

## 2016-10-01 NOTE — Telephone Encounter (Signed)
Called and left detailed vm advising pt that he is due for a FU. Requested call back to schedule an appt for ADHD.

## 2016-10-01 NOTE — Telephone Encounter (Signed)
Per last office note - he is due for follow-up for continuation of controlled substances.   "Instructions  Return for ADHD in 4 months or sooner if needed.   After Visit Summary (Printed 05/17/2016)"

## 2016-10-01 NOTE — Telephone Encounter (Signed)
Pt is calling for adderall scripts.  Please advise.

## 2016-10-04 ENCOUNTER — Telehealth: Payer: Self-pay

## 2016-10-04 NOTE — Telephone Encounter (Signed)
Pt called again asking for medication to just be refilled.  I explained that due to the nature of this medication, he would need to be seen.  Transferred to scheduling for appointment.

## 2016-10-05 ENCOUNTER — Encounter: Payer: Self-pay | Admitting: Osteopathic Medicine

## 2016-10-05 ENCOUNTER — Ambulatory Visit (INDEPENDENT_AMBULATORY_CARE_PROVIDER_SITE_OTHER): Payer: 59 | Admitting: Osteopathic Medicine

## 2016-10-05 VITALS — BP 111/74 | HR 74 | Ht 72.0 in | Wt 193.0 lb

## 2016-10-05 DIAGNOSIS — F988 Other specified behavioral and emotional disorders with onset usually occurring in childhood and adolescence: Secondary | ICD-10-CM | POA: Diagnosis not present

## 2016-10-05 DIAGNOSIS — Z23 Encounter for immunization: Secondary | ICD-10-CM

## 2016-10-05 MED ORDER — SYNTHROID 125 MCG PO TABS: 250.0000 ug | ORAL_TABLET | Freq: Every day | ORAL | 1 refills | Status: DC

## 2016-10-05 MED ORDER — AMPHETAMINE-DEXTROAMPHETAMINE 20 MG PO TABS: 30.0000 mg | ORAL_TABLET | Freq: Two times a day (BID) | ORAL | 0 refills | Status: DC

## 2016-10-05 NOTE — Progress Notes (Signed)
HPI: Wayne Newman is a 41 y.o. male  who presents to [name] today, 10/05/16,  for chief complaint of:  Chief Complaint  Patient presents with  . Follow-up    ADHD    Doing well on current medications. Needs refill.   Past medical, surgical, social and family history reviewed: Patient Active Problem List   Diagnosis Date Noted  . Eczema 04/23/2015  . Abdominal pain, epigastric 01/02/2015  . Mild sleep apnea 02/20/2013  . Fatigue 12/20/2012  . Elevated LFTs 09/29/2012  . ADD (attention deficit disorder) 08/24/2012  . Hypothyroid 08/24/2012   No past surgical history on file. Social History  Substance Use Topics  . Smoking status: Never Smoker  . Smokeless tobacco: Never Used  . Alcohol use Yes   Family History  Problem Relation Age of Onset  . Alcohol abuse Unknown        grandfather  . Heart attack Unknown        grandfather  . Stroke Unknown        grandfather     Current medication list and allergy/intolerance information reviewed:   Current Outpatient Prescriptions  Medication Sig Dispense Refill  . amphetamine-dextroamphetamine (ADDERALL) 20 MG tablet Take 1.5 tablets (30 mg total) by mouth 2 (two) times daily. Ok to fill on 06/17/2016 90 tablet 0  . SYNTHROID 125 MCG tablet Take 2 tablets (250 mcg total) by mouth daily before breakfast. 180 tablet 1   No current facility-administered medications for this visit.    Allergies  Allergen Reactions  . Sulfa Antibiotics Other (See Comments)    Given for pink eye & irritated eye more.      Review of Systems:  Constitutional:  No recent illness, No unintentional weight changes. No significant fatigue.   Cardiac: No  chest pain, No  pressure, No palpitations  Exam:  BP 111/74   Pulse 74   Ht 6' (1.829 m)   Wt 193 lb (87.5 kg)   BMI 26.18 kg/m   Constitutional: VS see above. General Appearance: alert, well-developed, well-nourished, NAD  Psychiatric: Normal  judgment/insight. Normal mood and affect. Oriented x3.     ASSESSMENT/PLAN:  Attention deficit disorder, unspecified hyperactivity presence - Plan: amphetamine-dextroamphetamine (ADDERALL) 20 MG tablet, DISCONTINUED: amphetamine-dextroamphetamine (ADDERALL) 20 MG tablet, DISCONTINUED: amphetamine-dextroamphetamine (ADDERALL) 20 MG tablet  Need for immunization against influenza     Visit summary with medication list and pertinent instructions was printed for patient to review. All questions at time of visit were answered - patient instructed to contact office with any additional concerns. ER/RTC precautions were reviewed with the patient. Follow-up plan: Return in about 3 months (around 01/04/2017) for ADHD medications, sooner if needed.  Note: Total time spent 5 minutes, greater than 50% of the visit was spent face-to-face counseling and coordinating care for the following: The primary encounter diagnosis was Attention deficit disorder, unspecified hyperactivity presence. A diagnosis of Need for immunization against influenza was also pertinent to this visit.Marland Kitchen

## 2016-12-28 ENCOUNTER — Ambulatory Visit (INDEPENDENT_AMBULATORY_CARE_PROVIDER_SITE_OTHER): Payer: 59 | Admitting: Osteopathic Medicine

## 2016-12-28 ENCOUNTER — Encounter: Payer: Self-pay | Admitting: Osteopathic Medicine

## 2016-12-28 DIAGNOSIS — F988 Other specified behavioral and emotional disorders with onset usually occurring in childhood and adolescence: Secondary | ICD-10-CM

## 2016-12-28 MED ORDER — AMPHETAMINE-DEXTROAMPHETAMINE 20 MG PO TABS: 30.0000 mg | ORAL_TABLET | Freq: Two times a day (BID) | ORAL | 0 refills | Status: DC

## 2016-12-28 NOTE — Progress Notes (Signed)
HPI: Wayne Newman is a 41 y.o. male  who [facility] today, 12/28/16,  for chief complaint of:  Chief Complaint  Patient presents with  . Follow-up ADHD meds    Doing well on current ADHD medications. Needs refill.   Past medical, surgical, social and family history reviewed: Patient Active Problem List   Diagnosis Date Noted  . Eczema 04/23/2015  . Abdominal pain, epigastric 01/02/2015  . Mild sleep apnea 02/20/2013  . Fatigue 12/20/2012  . Elevated LFTs 09/29/2012  . ADD (attention deficit disorder) 08/24/2012  . Hypothyroid 08/24/2012   No past surgical history on file. Social History   Tobacco Use  . Smoking status: Never Smoker  . Smokeless tobacco: Never Used  Substance Use Topics  . Alcohol use: Yes   Family History  Problem Relation Age of Onset  . Alcohol abuse Unknown        grandfather  . Heart attack Unknown        grandfather  . Stroke Unknown        grandfather     Current medication list and allergy/intolerance information reviewed:   Current Outpatient Medications  Medication Sig Dispense Refill  . amphetamine-dextroamphetamine (ADDERALL) 20 MG tablet Take 1.5 tablets (30 mg total) by mouth 2 (two) times daily. Ok to fill on 06/17/2016 90 tablet 0  . SYNTHROID 125 MCG tablet Take 2 tablets (250 mcg total) by mouth daily before breakfast. 180 tablet 1   No current facility-administered medications for this visit.    Allergies  Allergen Reactions  . Sulfa Antibiotics Other (See Comments)    Given for pink eye & irritated eye more.      Review of Systems:  Constitutional:  No recent illness, No unintentional weight changes. No significant fatigue.   Cardiac: No  chest pain, No  pressure, No palpitations  Exam:  BP 122/76   Pulse 72   Wt 191 lb 1.9 oz (86.7 kg)   BMI 25.92 kg/m   Constitutional: VS see above. General Appearance: alert, well-developed, well-nourished, NAD  CV: RRR, S1S2  normal   Psychiatric: Normal judgment/insight. Normal mood and affect. Oriented x3.     ASSESSMENT/PLAN:  Attention deficit disorder, unspecified hyperactivity presence - Plan: amphetamine-dextroamphetamine (ADDERALL) 20 MG tablet 90 days supply sent e-Rx      Visit summary with medication list and pertinent instructions was printed for patient to review. All questions at time of visit were answered - patient instructed to contact office with any additional concerns. ER/RTC precautions were reviewed with the patient.   Follow-up plan: Return in about 6 months (around 06/28/2017) [facility] (ok to refill electronically in 3 months) .

## 2017-01-31 ENCOUNTER — Telehealth: Payer: Self-pay

## 2017-01-31 NOTE — Telephone Encounter (Signed)
Patient called in requesting new Rx for his Adderall be electronically sent to [facility] due to [facility] is presently out of medication.

## 2017-02-01 NOTE — Telephone Encounter (Signed)
As per pharmacist - due to pt's insurance can only have a 30 day supply for adderall. Pharmacist will dispense #30 and will contact pt to pick up adderall med. Pls note, current rx on file will no longer be valid. Pt will need a new rx when due for refill.

## 2017-02-01 NOTE — Telephone Encounter (Signed)
90 day supply was sent to his pharmacy back in December - 12/28/2016. Can we call his pharmacy [phone] and confirm whether they need a new prescription? (may be that the Rx written 12/28/16 won't be filled after 30 days from date written, but I was under the impression that the expiration was 6 typically months so asking more as FYI for me)  Clinical note: PMP reviewed, last filled date was 12/18/2016 for 90 tablets/30 days.

## 2017-02-01 NOTE — Telephone Encounter (Signed)
Noted  

## 2017-03-11 ENCOUNTER — Other Ambulatory Visit: Payer: Self-pay | Admitting: Osteopathic Medicine

## 2017-03-11 ENCOUNTER — Other Ambulatory Visit: Payer: Self-pay

## 2017-03-11 DIAGNOSIS — F988 Other specified behavioral and emotional disorders with onset usually occurring in childhood and adolescence: Secondary | ICD-10-CM

## 2017-03-11 MED ORDER — AMPHETAMINE-DEXTROAMPHETAMINE 20 MG PO TABS: 30.0000 mg | ORAL_TABLET | Freq: Two times a day (BID) | ORAL | 0 refills | Status: DC

## 2017-03-11 NOTE — Telephone Encounter (Addendum)
Patient called in requesting medication refill for his Adderall.  Called patient back - LMOVMTC with pharmacy information. Also, advised patient per his insurance - refills will be #30 instead of #60.  Patient returned call - updated pharmacy information. Advised would forward refill request to provider.

## 2017-04-18 ENCOUNTER — Other Ambulatory Visit: Payer: Self-pay

## 2017-04-18 DIAGNOSIS — F988 Other specified behavioral and emotional disorders with onset usually occurring in childhood and adolescence: Secondary | ICD-10-CM

## 2017-04-18 MED ORDER — AMPHETAMINE-DEXTROAMPHETAMINE 20 MG PO TABS: 30.0000 mg | ORAL_TABLET | Freq: Two times a day (BID) | ORAL | 0 refills | Status: DC

## 2017-04-18 NOTE — Telephone Encounter (Signed)
Wayne Newman requests a refill on Adderall.

## 2017-05-26 ENCOUNTER — Telehealth: Payer: Self-pay

## 2017-05-26 DIAGNOSIS — F988 Other specified behavioral and emotional disorders with onset usually occurring in childhood and adolescence: Secondary | ICD-10-CM

## 2017-05-26 MED ORDER — AMPHETAMINE-DEXTROAMPHETAMINE 20 MG PO TABS: 30.0000 mg | ORAL_TABLET | Freq: Two times a day (BID) | ORAL | 0 refills | Status: DC

## 2017-05-26 NOTE — Telephone Encounter (Signed)
Pt advised verbalized understanding.

## 2017-05-26 NOTE — Telephone Encounter (Signed)
Refill sent His 64-month follow-up will be due next month, though!  From last note: 12/28/16 Follow-up plan: Return in about 6 months (around 06/28/2017) for [facility] PHYSICAL AND ADHD MED REFILLS

## 2017-05-27 NOTE — Telephone Encounter (Signed)
No additional encounter notes.

## 2017-06-23 ENCOUNTER — Encounter: Payer: Self-pay | Admitting: Osteopathic Medicine

## 2017-06-23 ENCOUNTER — Ambulatory Visit (INDEPENDENT_AMBULATORY_CARE_PROVIDER_SITE_OTHER): Payer: 59 | Admitting: Osteopathic Medicine

## 2017-06-23 VITALS — BP 123/90 | HR 100 | Temp 98.4°F | Wt 188.8 lb

## 2017-06-23 DIAGNOSIS — Z0000 Encounter for general adult medical examination without abnormal findings: Secondary | ICD-10-CM | POA: Diagnosis not present

## 2017-06-23 DIAGNOSIS — F988 Other specified behavioral and emotional disorders with onset usually occurring in childhood and adolescence: Secondary | ICD-10-CM

## 2017-06-23 DIAGNOSIS — E039 Hypothyroidism, unspecified: Secondary | ICD-10-CM | POA: Diagnosis not present

## 2017-06-23 MED ORDER — AMPHETAMINE-DEXTROAMPHETAMINE 20 MG PO TABS: 30.0000 mg | ORAL_TABLET | Freq: Every day | ORAL | 0 refills | Status: DC

## 2017-06-23 MED ORDER — AMPHETAMINE-DEXTROAMPHET ER 30 MG PO CP24: 30.0000 mg | ORAL_CAPSULE | Freq: Every day | ORAL | 0 refills | Status: DC

## 2017-06-23 NOTE — Progress Notes (Signed)
HPI: Wayne Newman is a 42 y.o. male  who [facility] today, 06/23/17,  for chief complaint of: Attention deficit disorder follow-up   ADHD: doing fairly well on current medications but he feels he is at a point where they are starting to wear off a bit sooner than the used to.  Thyroid: Taking medications as noted below. On high dose, monitoring TSH closely.    Past medical, surgical, social and family history reviewed: Patient Active Problem List   Diagnosis Date Noted  . Eczema 04/23/2015  . Abdominal pain, epigastric 01/02/2015  . Mild sleep apnea 02/20/2013  . Fatigue 12/20/2012  . Elevated LFTs 09/29/2012  . ADD (attention deficit disorder) 08/24/2012  . Hypothyroid 08/24/2012   No past surgical history on file. Social History   Tobacco Use  . Smoking status: Never Smoker  . Smokeless tobacco: Never Used  Substance Use Topics  . Alcohol use: Yes   Family History  Problem Relation Age of Onset  . Alcohol abuse Unknown        grandfather  . Heart attack Unknown        grandfather  . Stroke Unknown        grandfather     Current medication list and allergy/intolerance information reviewed:   Current Outpatient Medications  Medication Sig Dispense Refill  . amphetamine-dextroamphetamine (ADDERALL) 20 MG tablet Take 1.5 tablets (30 mg total) by mouth 2 (two) times daily. 90 tablet 0  . SYNTHROID 125 MCG tablet Take 2 tablets (250 mcg total) by mouth daily before breakfast. 180 tablet 1   No current facility-administered medications for this visit.    Allergies  Allergen Reactions  . Sulfur Rash and Other (See Comments)    Had a sulfur abx for pink eye and made eyes more irritated and red   . Sulfa Antibiotics Other (See Comments)    Given for pink eye & irritated eye more.      Review of Systems:  Constitutional:  No  fever, no chills, No recent illness, No unintentional weight changes. No significant fatigue.    HEENT: No  headache,  Cardiac: No  chest pain  Respiratory:  No  shortness of breath.  Endocrine: No cold intolerance,  No heat intolerance.  Psychiatric: No  concerns with depression, No  concerns with anxiety  Exam:  BP 123/90 (BP Location: Left Arm, Patient Position: Sitting, Cuff Size: Normal)   Pulse 100   Temp 98.4 F (36.9 C) (Oral)   Wt 188 lb 12.8 oz (85.6 kg)   BMI 25.61 kg/m   Constitutional: VS see above. General Appearance: alert, well-developed, well-nourished, NAD  Eyes: Normal lids and conjunctive, non-icteric sclera  Ears, Nose, Mouth, Throat: MMM, Normal external inspection ears/nares/mouth/lips/gums.   Neck: No masses, trachea midline.   Respiratory: Normal respiratory effort. no wheeze, no rhonchi, no rales  Cardiovascular: S1/S2 normal, no murmur, no rub/gallop auscultated. RRR. No lower extremity edema.   Musculoskeletal: Gait normal.  Neurological: Normal balance/coordination. No tremor.   Skin: warm, dry, intact.   Psychiatric: Normal judgment/insight. Normal mood and affect. Oriented x3.       ASSESSMENT/PLAN:   Attention deficit disorder, unspecified hyperactivity presence - Trial transition to long-acting morning dose and short acting afternoon dose as needed, see how this does over the next month or so - Plan: amphetamine-dextroamphetamine (ADDERALL XR) 30 MG 24 hr capsule, amphetamine-dextroamphetamine (ADDERALL) 20 MG tablet  Hypothyroidism, unspecified type - Plan: TSH  Annual physical exam -  Labs ordered for future visit, preventive care not performed or charged today - Plan: CBC, COMPLETE METABOLIC PANEL WITH GFR, Lipid panel, TSH, VITAMIN D 25 Hydroxy (Vit-D Deficiency, Fractures)     Visit summary with medication list and pertinent instructions was printed for patient to review. All questions at time of visit were answered - patient instructed to contact office with any additional concerns. ER/RTC precautions were reviewed  with the patient. Follow-up plan: Return in about 1 month (around 07/21/2017) for annual physical exam and recheck ADHD - sooner if needed .

## 2017-06-24 ENCOUNTER — Encounter: Payer: Self-pay | Admitting: Osteopathic Medicine

## 2017-07-09 LAB — LIPID PANEL
Cholesterol: 176 mg/dL (ref <200)
HDL: 49 mg/dL (ref >40)
LDL Cholesterol (Calc): 106 mg/dL (calc) — ABNORMAL HIGH
Non-HDL Cholesterol (Calc): 127 mg/dL (calc) (ref <130)
Total CHOL/HDL Ratio: 3.6 (calc) (ref <5.0)
Triglycerides: 117 mg/dL (ref <150)

## 2017-07-09 LAB — COMPLETE METABOLIC PANEL WITH GFR
AG RATIO: 1.9 (calc) (ref 1.0–2.5)
ALT: 56 U/L — ABNORMAL HIGH (ref 9–46)
AST: 32 U/L (ref 10–40)
Albumin: 4.5 g/dL (ref 3.6–5.1)
Alkaline phosphatase (APISO): 53 U/L (ref 40–115)
BILIRUBIN TOTAL: 1.2 mg/dL (ref 0.2–1.2)
BUN: 20 mg/dL (ref 7–25)
CALCIUM: 9.5 mg/dL (ref 8.6–10.3)
CO2: 27 mmol/L (ref 20–32)
Chloride: 106 mmol/L (ref 98–110)
Creat: 1 mg/dL (ref 0.60–1.35)
GFR, EST AFRICAN AMERICAN: 109 mL/min/{1.73_m2} (ref >=60)
GFR, EST NON AFRICAN AMERICAN: 94 mL/min/{1.73_m2} (ref >=60)
GLOBULIN: 2.4 g/dL (ref 1.9–3.7)
Glucose, Bld: 87 mg/dL (ref 65–99)
POTASSIUM: 4.5 mmol/L (ref 3.5–5.3)
SODIUM: 140 mmol/L (ref 135–146)
Total Protein: 6.9 g/dL (ref 6.1–8.1)

## 2017-07-09 LAB — CBC
HEMATOCRIT: 47.5 % (ref 38.5–50.0)
Hemoglobin: 16.1 g/dL (ref 13.2–17.1)
MCH: 31 pg (ref 27.0–33.0)
MCHC: 33.9 g/dL (ref 32.0–36.0)
MCV: 91.3 fL (ref 80.0–100.0)
MPV: 10.2 fL (ref 7.5–12.5)
Platelets: 198 10*3/uL (ref 140–400)
RBC: 5.2 10*6/uL (ref 4.20–5.80)
RDW: 11.8 % (ref 11.0–15.0)
WBC: 6.1 10*3/uL (ref 3.8–10.8)

## 2017-07-09 LAB — VITAMIN D 25 HYDROXY (VIT D DEFICIENCY, FRACTURES): VIT D 25 HYDROXY: 36 ng/mL (ref 30–100)

## 2017-07-09 LAB — TSH: TSH: 1.22 m[IU]/L (ref 0.40–4.50)

## 2017-07-29 ENCOUNTER — Other Ambulatory Visit: Payer: Self-pay

## 2017-07-29 DIAGNOSIS — F988 Other specified behavioral and emotional disorders with onset usually occurring in childhood and adolescence: Secondary | ICD-10-CM

## 2017-07-29 MED ORDER — AMPHETAMINE-DEXTROAMPHETAMINE 20 MG PO TABS: 30.0000 mg | ORAL_TABLET | Freq: Every day | ORAL | 0 refills | Status: DC

## 2017-07-29 MED ORDER — AMPHETAMINE-DEXTROAMPHET ER 30 MG PO CP24: 30.0000 mg | ORAL_CAPSULE | Freq: Every day | ORAL | 0 refills | Status: DC

## 2017-07-29 NOTE — Telephone Encounter (Signed)
Wayne Newman requests a refill on Adderall. He has a follow up at the end of the month.

## 2017-08-09 ENCOUNTER — Ambulatory Visit (INDEPENDENT_AMBULATORY_CARE_PROVIDER_SITE_OTHER): Payer: 59 | Admitting: Osteopathic Medicine

## 2017-08-09 ENCOUNTER — Encounter: Payer: Self-pay | Admitting: Osteopathic Medicine

## 2017-08-09 VITALS — BP 116/74 | HR 74 | Temp 97.7°F | Wt 192.7 lb

## 2017-08-09 DIAGNOSIS — F988 Other specified behavioral and emotional disorders with onset usually occurring in childhood and adolescence: Secondary | ICD-10-CM

## 2017-08-09 DIAGNOSIS — Z0000 Encounter for general adult medical examination without abnormal findings: Secondary | ICD-10-CM | POA: Diagnosis not present

## 2017-08-09 DIAGNOSIS — R748 Abnormal levels of other serum enzymes: Secondary | ICD-10-CM | POA: Diagnosis not present

## 2017-08-09 MED ORDER — AMPHET-DEXTROAMPHET 3-BEAD ER 37.5 MG PO CP24: 30.0000 mg | ORAL_CAPSULE | Freq: Every day | ORAL | 0 refills | Status: DC

## 2017-08-09 NOTE — Patient Instructions (Signed)
Review preventive care:   General Preventive Care  Most recent routine screening lipids/other labs: already done!   Tobacco: don't! Alcohol: moderation is ok for most people. Recreational/Illicit Drugs: don't!  Exercise: as tolerated to reduce risk of cardiovascular disease and diabetes  Mental health: if need for mental health care (medicines, counseling, other), or concerns about moods, please let me know!   Sexual health: if need for STD testing, or any problems with libido, please let me know!   Vaccines  Flu vaccine: recommended every fall (by Halloween!)  Shingles vaccine: Shingrix recommended after age 41  Tetanus booster: Tdap recommended every 10 years  Cancer screenings   Colon cancer screening: recommended at age 41, colonoscopy sooner if risk factors   Prostate cancer screening: recommendations vary, optional PSA blood test form en around age 41  Infection screenings . HIV: recommended screening at least once age 41-65, more often if risk factors  . Gonorrhea/Chlamydia: many insurances require testing for anyone on birth control pills, otherwise screening as needed . Hepatitis C: recommended for anyone born [id number]  Other . Aspirin: don't need . Bone Density Test: recommended for women at age 41, men at age 41, sooner depending on risk factors . Advanced Directive: Living Will and/or Healthcare Power of Attorney recommended for everyone, regardless of age or health . Cholesterol: recommended screening annually . Diabetes: recommended screening annually  . Thyroid and Vitamin D: routine screening not recommended, most insurance will not cover this test

## 2017-08-09 NOTE — Progress Notes (Signed)
HPI: Wayne BoastRyan Newman is a 42 y.o. male who  has a past medical history of Thyroid disease.  he [facility] today, 08/09/17,  for chief complaint of: Annual Physical ADHD refill    Patient here for annual physical / wellness exam.  See preventive care reviewed as below.  Recent labs from 07/08/17 were reviewed in detail with the patient.   Additional concerns today include:  ADHD refills. 06/23/17 transitioned to long-acting morning dose and short acting afternoon dose as needed.  He thinks the long-acting dose does not feel quite as effective and would like to try increasing the dose of this.  The afternoon medicine seems to be working well.      Past medical, surgical, social and family history reviewed:  Patient Active Problem List   Diagnosis Date Noted  . Eczema 04/23/2015  . Abdominal pain, epigastric 01/02/2015  . Mild sleep apnea 02/20/2013  . Fatigue 12/20/2012  . Elevated LFTs 09/29/2012  . ADD (attention deficit disorder) 08/24/2012  . Hypothyroid 08/24/2012    No past surgical history on file.  Social History   Tobacco Use  . Smoking status: Never Smoker  . Smokeless tobacco: Never Used  Substance Use Topics  . Alcohol use: Yes    Family History  Problem Relation Age of Onset  . Alcohol abuse Unknown        grandfather  . Heart attack Unknown        grandfather  . Stroke Unknown        grandfather     Current medication list and allergy/intolerance information reviewed:    Current Outpatient Medications  Medication Sig Dispense Refill  . amphetamine-dextroamphetamine (ADDERALL XR) 30 MG 24 hr capsule Take 1 capsule (30 mg total) by mouth daily. 30 capsule 0  . amphetamine-dextroamphetamine (ADDERALL) 20 MG tablet Take 1.5 tablets (30 mg total) by mouth daily. Afternoons, as needed 45 tablet 0  . SYNTHROID 125 MCG tablet Take 2 tablets (250 mcg total) by mouth daily before breakfast. 180 tablet 1   No  current facility-administered medications for this visit.     Allergies  Allergen Reactions  . Sulfur Rash and Other (See Comments)    Had a sulfur abx for pink eye and made eyes more irritated and red   . Sulfa Antibiotics Other (See Comments)    Given for pink eye & irritated eye more.      Review of Systems:  Constitutional:  No  fever, no chills, No recent illness, No unintentional weight changes. No significant fatigue.   HEENT: No  headache, no vision change, no hearing change, No sore throat, No  sinus pressure  Cardiac: No  chest pain, No  pressure, No palpitations, No  Orthopnea  Respiratory:  No  shortness of breath. No  Cough  Gastrointestinal: No  abdominal pain, No  nausea, No  vomiting,  No  blood in stool, No  diarrhea, No  constipation   Musculoskeletal: No new myalgia/arthralgia  Skin: No  Rash, No other wounds/concerning lesions  Genitourinary: No  incontinence, No  abnormal genital bleeding, No abnormal genital discharge  Hem/Onc: No  easy bruising/bleeding, No  abnormal lymph node  Endocrine: No cold intolerance,  No heat intolerance. No polyuria/polydipsia/polyphagia   Neurologic: No  weakness, No  dizziness, No  slurred speech/focal weakness/facial droop  Psychiatric: No  concerns with depression, No  concerns with anxiety, No sleep problems, No mood problems  Exam:  BP 116/74 (BP Location:  Left Arm, Patient Position: Sitting, Cuff Size: Normal)   Pulse 74   Temp 97.7 F (36.5 C) (Oral)   Wt 192 lb 11.2 oz (87.4 kg)   BMI 26.13 kg/m   Constitutional: VS see above. General Appearance: alert, well-developed, well-nourished, NAD  Eyes: Normal lids and conjunctive, non-icteric sclera  Ears, Nose, Mouth, Throat: MMM, Normal external inspection ears/nares/mouth/lips/gums. TM normal bilaterally. Pharynx/tonsils no erythema, no exudate. Nasal mucosa normal.   Neck: No masses, trachea midline. No thyroid enlargement. No tenderness/mass appreciated.  No lymphadenopathy  Respiratory: Normal respiratory effort. no wheeze, no rhonchi, no rales  Cardiovascular: S1/S2 normal, no murmur, no rub/gallop auscultated. RRR. No lower extremity edema. Pedal pulse II/IV bilaterally DP and PT. No carotid bruit or JVD. No abdominal aortic bruit.  Gastrointestinal: Nontender, no masses. No hepatomegaly, no splenomegaly. No hernia appreciated. Bowel sounds normal. Rectal exam deferred.   Musculoskeletal: Gait normal. No clubbing/cyanosis of digits.   Neurological: Normal balance/coordination. No tremor. No cranial nerve deficit on limited exam. Motor and sensation intact and symmetric. Cerebellar reflexes intact.   Skin: warm, dry, intact. No rash/ulcer. No concerning nevi or subq nodules on limited exam.    Psychiatric: Normal judgment/insight. Normal mood and affect. Oriented x3.   Recent Results (from the past 2160 hour(s))  CBC     Status: None   Collection Time: 07/08/17  7:35 AM  Result Value Ref Range   WBC 6.1 3.8 - 10.8 Thousand/uL   RBC 5.20 4.20 - 5.80 Million/uL   Hemoglobin 16.1 13.2 - 17.1 g/dL   HCT 40.9 81.1 - 91.4 %   MCV 91.3 80.0 - 100.0 fL   MCH 31.0 27.0 - 33.0 pg   MCHC 33.9 32.0 - 36.0 g/dL   RDW 78.2 95.6 - 21.3 %   Platelets 198 140 - 400 Thousand/uL   MPV 10.2 7.5 - 12.5 fL  COMPLETE METABOLIC PANEL WITH GFR     Status: Abnormal   Collection Time: 07/08/17  7:35 AM  Result Value Ref Range   Glucose, Bld 87 65 - 99 mg/dL    Comment: .            Fasting reference interval .    BUN 20 7 - 25 mg/dL   Creat 0.86 5.78 - 4.69 mg/dL   GFR, Est Non African American 94 > OR = 60 mL/min/1.29m2   GFR, Est African American 109 > OR = 60 mL/min/1.68m2   BUN/Creatinine Ratio NOT APPLICABLE 6 - 22 (calc)   Sodium 140 135 - 146 mmol/L   Potassium 4.5 3.5 - 5.3 mmol/L   Chloride 106 98 - 110 mmol/L   CO2 27 20 - 32 mmol/L   Calcium 9.5 8.6 - 10.3 mg/dL   Total Protein 6.9 6.1 - 8.1 g/dL   Albumin 4.5 3.6 - 5.1 g/dL    Globulin 2.4 1.9 - 3.7 g/dL (calc)   AG Ratio 1.9 1.0 - 2.5 (calc)   Total Bilirubin 1.2 0.2 - 1.2 mg/dL   Alkaline phosphatase (APISO) 53 40 - 115 U/L   AST 32 10 - 40 U/L   ALT 56 (H) 9 - 46 U/L  Lipid panel     Status: Abnormal   Collection Time: 07/08/17  7:35 AM  Result Value Ref Range   Cholesterol 176 <200 mg/dL   HDL 49 >62 mg/dL   Triglycerides 952 <841 mg/dL   LDL Cholesterol (Calc) 106 (H) mg/dL (calc)    Comment: Reference range: <100 . Desirable range <100 mg/dL  for primary prevention;   <70 mg/dL for patients with CHD or diabetic patients  with > or = 2 CHD risk factors. Marland Kitchen LDL-C is now calculated using the Martin-Hopkins  calculation, which is a validated novel method providing  better accuracy than the Friedewald equation in the  estimation of LDL-C.  Horald Pollen et al. Lenox Ahr. 0272;536(64): 2061-2068  (http://education.[facility].com/faq/FAQ164)    Total CHOL/HDL Ratio 3.6 <5.0 (calc)   Non-HDL Cholesterol (Calc) 127 <130 mg/dL (calc)    Comment: For patients with diabetes plus 1 major ASCVD risk  factor, treating to a non-HDL-C goal of <100 mg/dL  (LDL-C of <40 mg/dL) is considered a therapeutic  option.   TSH     Status: None   Collection Time: 07/08/17  7:35 AM  Result Value Ref Range   TSH 1.22 0.40 - 4.50 mIU/L  VITAMIN D 25 Hydroxy (Vit-D Deficiency, Fractures)     Status: None   Collection Time: 07/08/17  7:35 AM  Result Value Ref Range   Vit D, 25-Hydroxy 36 30 - 100 ng/mL    Comment: Vitamin D Status         25-OH Vitamin D: . Deficiency:                    <20 ng/mL Insufficiency:             20 - 29 ng/mL Optimal:                 > or = 30 ng/mL . For 25-OH Vitamin D testing on patients on  D2-supplementation and patients for whom quantitation  of D2 and D3 fractions is required, the [facility]ure[facility](TM) 25-OH VIT D, (D2,D3), LC/MS/MS is recommended: order  code 34742 (patients >72yrs). . For more information on this test, go  to: http://education.[facility].com/faq/FAQ163 (This link is being provided for  informational/educational purposes only.)     No results found.   ASSESSMENT/PLAN:   Annual physical exam  Attention deficit disorder, unspecified hyperactivity presence - Trial increasing dose of a.m. long-acting medicine - Plan: Amphet-Dextroamphet 3-Bead ER 37.5 MG CP24  Elevated liver enzymes - Mild, a bit elevated from previous.  Denies alcohol use/dependence.  We will plan to recheck in another 3 to 6 months    Patient Instructions  Review preventive care:   General Preventive Care  Most recent routine screening lipids/other labs: already done!   Tobacco: don't! Alcohol: moderation is ok for most people. Recreational/Illicit Drugs: don't!  Exercise: as tolerated to reduce risk of cardiovascular disease and diabetes  Mental health: if need for mental health care (medicines, counseling, other), or concerns about moods, please let me know!   Sexual health: if need for STD testing, or any problems with libido, please let me know!   Vaccines  Flu vaccine: recommended every fall (by Halloween!)  Shingles vaccine: Shingrix recommended after age 6  Tetanus booster: Tdap recommended every 10 years  Cancer screenings   Colon cancer screening: recommended at age 66, colonoscopy sooner if risk factors   Prostate cancer screening: recommendations vary, optional PSA blood test form en around age 75  Infection screenings . HIV: recommended screening at least once age 60-65, more often if risk factors  . Gonorrhea/Chlamydia: many insurances require testing for anyone on birth control pills, otherwise screening as needed . Hepatitis C: recommended for anyone born 08-1962  Other . Aspirin: don't need . Bone Density Test: recommended for women at age 67, men at age 4, sooner depending on risk  factors . Advanced Directive: Living Will and/or Healthcare Power of Attorney recommended for  everyone, regardless of age or health . Cholesterol: recommended screening annually . Diabetes: recommended screening annually  . Thyroid and Vitamin D: routine screening not recommended, most insurance will not cover this test           Visit summary with medication list and pertinent instructions was printed for patient to review. All questions at time of visit were answered - patient instructed to contact office with any additional concerns. ER/RTC precautions were reviewed with the patient.   Follow-up plan: Return in about 1 month (around 09/06/2017) for recheck on higher dose Adderall - call/message sooner if needed .    Please note: voice recognition software was used to produce this document, and typos may escape review. Please contact Dr. Lyn Hollingshead for any needed clarifications.

## 2017-08-10 ENCOUNTER — Telehealth: Payer: Self-pay | Admitting: Osteopathic Medicine

## 2017-08-10 ENCOUNTER — Encounter: Payer: Self-pay | Admitting: Osteopathic Medicine

## 2017-08-10 NOTE — Telephone Encounter (Signed)
I received a fax from insurance that Mydayis was excluded and not covered from the plan. Patient will have to try Vyvanse per letter. Please advise.

## 2017-08-11 MED ORDER — LISDEXAMFETAMINE DIMESYLATE 60 MG PO CAPS: 60.0000 mg | ORAL_CAPSULE | Freq: Every day | ORAL | 0 refills | Status: DC

## 2017-08-11 NOTE — Telephone Encounter (Signed)
Please call patient and let him know that I switched to Vyvanse which is a little bit different from the Adderall still a stimulant medication to treat ADHD, should be covered by his insurance.  May work a bit better for him, will see how it goes but let me know if there are any problems with the medication.

## 2017-08-12 NOTE — Telephone Encounter (Signed)
Patient was informed of the medication change and patient will pick up medication from the pharmacy. Patient will keep the follow up with Dr. Lyn HollingsheadAlexander.

## 2017-08-25 ENCOUNTER — Other Ambulatory Visit: Payer: Self-pay | Admitting: Osteopathic Medicine

## 2017-09-09 ENCOUNTER — Ambulatory Visit (INDEPENDENT_AMBULATORY_CARE_PROVIDER_SITE_OTHER): Payer: 59 | Admitting: Osteopathic Medicine

## 2017-09-09 DIAGNOSIS — F988 Other specified behavioral and emotional disorders with onset usually occurring in childhood and adolescence: Secondary | ICD-10-CM

## 2017-09-09 MED ORDER — LISDEXAMFETAMINE DIMESYLATE 60 MG PO CAPS: 60.0000 mg | ORAL_CAPSULE | Freq: Every day | ORAL | 0 refills | Status: DC

## 2017-09-09 MED ORDER — AMPHETAMINE-DEXTROAMPHETAMINE 20 MG PO TABS: 30.0000 mg | ORAL_TABLET | Freq: Every day | ORAL | 0 refills | Status: DC

## 2017-09-09 NOTE — Progress Notes (Signed)
Attention deficit disorder, unspecified hyperactivity presence - Had to reschedule follow-up, refliled current meds  - Plan: amphetamine-dextroamphetamine (ADDERALL) 20 MG tablet

## 2017-09-20 ENCOUNTER — Encounter: Payer: Self-pay | Admitting: Osteopathic Medicine

## 2017-09-20 ENCOUNTER — Ambulatory Visit (INDEPENDENT_AMBULATORY_CARE_PROVIDER_SITE_OTHER): Payer: 59 | Admitting: Osteopathic Medicine

## 2017-09-20 VITALS — BP 112/68 | HR 75 | Temp 97.7°F | Wt 189.5 lb

## 2017-09-20 DIAGNOSIS — Z23 Encounter for immunization: Secondary | ICD-10-CM | POA: Diagnosis not present

## 2017-09-20 DIAGNOSIS — F988 Other specified behavioral and emotional disorders with onset usually occurring in childhood and adolescence: Secondary | ICD-10-CM | POA: Diagnosis not present

## 2017-09-20 MED ORDER — LISDEXAMFETAMINE DIMESYLATE 70 MG PO CAPS: 70.0000 mg | ORAL_CAPSULE | Freq: Every day | ORAL | 0 refills | Status: DC

## 2017-09-20 NOTE — Progress Notes (Signed)
HPI: Wayne Newman is a 42 y.o. male who  has a past medical history of Thyroid disease.  he presents to [facility] today, 09/20/17,  for chief complaint of:  ADHD medication  Had been taking immediate release Adderall twice daily for some time, then around 06/23/17 transitioned to long-acting morning dose and short acting afternoon dose as needed since he thought the medications seem to be wearing off a little bit more.  He thought the long-acting dose did not feel quite as effective and so we tried increasing the dose of this 08/09/17.  The afternoon medicine seemed to be working well. Adderall 37.5 mg wasn't covered, we tried Vyvanse per insurance recommendations.  Patient states he has been taking this, feeling a little bit different on it but not bad, seems to be helping ADHD, would like to try increasing the dose of this a little bit.    Past medical history, surgical history, and family history reviewed.  Current medication list and allergy/intolerance information reviewed.   (See remainder of HPI, ROS, Phys Exam below)  No results found.  No results found for this or any previous visit (from the past 72 hour(s)).   ASSESSMENT/PLAN:   Attention deficit disorder, unspecified hyperactivity presence  Need for influenza vaccination - Plan: Flu Vaccine QUAD 6+ mos PF IM (Fluarix Quad PF)   Meds ordered this encounter  Medications  . lisdexamfetamine (VYVANSE) 70 MG capsule    Sig: Take 1 capsule (70 mg total) by mouth daily.    Dispense:  30 capsule    Refill:  0    Patient Instructions  Call/message me to let me know how the 70 mg is working    Follow-up plan: Return for recheck depending on how Vyvanse is working .     ############################################ ############################################ ############################################ ############################################    Outpatient Encounter  Medications as of 09/20/2017  Medication Sig  . amphetamine-dextroamphetamine (ADDERALL) 20 MG tablet Take 1.5 tablets (30 mg total) by mouth daily. Afternoons, as needed  . lisdexamfetamine (VYVANSE) 60 MG capsule Take 1 capsule (60 mg total) by mouth daily.  Marland Kitchen SYNTHROID 125 MCG tablet TAKE 2 TABLETS BY MOUTH DAILY BEFORE BREAKFAST   No facility-administered encounter medications on file as of 09/20/2017.    Allergies  Allergen Reactions  . Sulfur Rash and Other (See Comments)    Had a sulfur abx for pink eye and made eyes more irritated and red   . Sulfa Antibiotics Other (See Comments)    Given for pink eye & irritated eye more.      Review of Systems:  Constitutional: No recent illness  Cardiac: No  chest pain, No  pressure, No palpitations  Neurologic: No  weakness, No  Dizziness  Psychiatric: No  concerns with depression, No  concerns with anxiety  Exam:  BP 112/68 (BP Location: Left Arm, Patient Position: Sitting, Cuff Size: Normal)   Pulse 75   Temp 97.7 F (36.5 C) (Oral)   Wt 189 lb 8 oz (86 kg)   BMI 25.70 kg/m   Constitutional: VS see above. General Appearance: alert, well-developed, well-nourished, NAD  Eyes: Normal lids and conjunctive, non-icteric sclera  Ears, Nose, Mouth, Throat: MMM, Normal external inspection ears/nares/mouth/lips/gums.  Neck: No masses, trachea midline.   Respiratory: Normal respiratory effort. no wheeze, no rhonchi, no rales  Cardiovascular: S1/S2 normal, no murmur, no rub/gallop auscultated. RRR.   Musculoskeletal: Gait normal. Symmetric and independent movement of all extremities  Neurological: Normal balance/coordination. No tremor.  Skin: warm, dry, intact.   Psychiatric: Normal judgment/insight. Normal mood and affect. Oriented x3.   Visit summary with medication list and pertinent instructions was printed for patient to review, advised to alert [place] if any changes needed. All questions at time of visit were answered -  patient instructed to contact office with any additional concerns. ER/RTC precautions were reviewed with the patient and understanding verbalized.   Follow-up plan: Return for recheck depending on how Vyvanse is working .    Please note: voice recognition software was used to produce this document, and typos may escape review. Please contact Dr. Lyn Hollingshead for any needed clarifications.

## 2017-09-20 NOTE — Patient Instructions (Signed)
Call/message me to let me know how the 70 mg is working

## 2017-10-18 ENCOUNTER — Telehealth: Payer: Self-pay

## 2017-10-18 DIAGNOSIS — F988 Other specified behavioral and emotional disorders with onset usually occurring in childhood and adolescence: Secondary | ICD-10-CM

## 2017-10-18 NOTE — Telephone Encounter (Signed)
Pt calling for Adderall and Vyvanse refills  Wanting to know if maybe Vyvanse can be changed back to extended release or if maybe a different medication is needed? States he does not feel it is working as well and he has discussed this with provider.   Please advise

## 2017-10-20 MED ORDER — AMPHETAMINE-DEXTROAMPHETAMINE 20 MG PO TABS: 30.0000 mg | ORAL_TABLET | Freq: Two times a day (BID) | ORAL | 0 refills | Status: DC

## 2017-10-20 NOTE — Telephone Encounter (Signed)
Okay, I just went ahead and sent the Adderall to take twice a day, let us try this for a while and see how that works

## 2017-10-20 NOTE — Telephone Encounter (Signed)
Leftpt msg of medication update. Advised to follow up with [place] in office if chest pain does not resolve

## 2017-10-20 NOTE — Telephone Encounter (Signed)
Note in chart from Francesco Runner that states "I received a fax from insurance that Mydayis was excluded and not covered from the plan. Patient will have to try Vyvanse per letter. Please advise. "  Pt wanting to know he we can re-attempt this medication? States that also since being on Vyvanse he has had some episodes of chest pain/rapid heart rate.   Also, can he have his regular Adderall RX sent in since he is out, while he awaits insurance determination on other medication?  Please advise

## 2017-10-20 NOTE — Telephone Encounter (Signed)
Vyvanse itself is an extended release medicine.  If he wants to we can try the Adderall twice per day?  Otherwise, I would probably ask that he contact his insurance company and see what is on their formulary for ADHD medications

## 2017-11-21 ENCOUNTER — Telehealth: Payer: Self-pay | Admitting: Osteopathic Medicine

## 2017-11-21 NOTE — Telephone Encounter (Signed)
Pt called for adderall Rx refill. Requesting dose to be increased. Routing.

## 2017-11-22 NOTE — Telephone Encounter (Signed)
[name] called again asking for a increase of the ADHD medication. Please advise.

## 2017-11-22 NOTE — Telephone Encounter (Signed)
He was already at the maximum dose of Vyvanse (old Rx, removed from list) He is actually taking beyond the mend maximum of Adderall.  Given the number of medications that he has tried and found to be not effective for ADHD, I think we should strongly consider sending him to see a psychiatrist to help with medication management.  Can call and let him know, if he is open to this I can send referral and in the meanwhile refill what he is already taking.

## 2017-11-23 MED ORDER — LISDEXAMFETAMINE DIMESYLATE 70 MG PO CAPS: 70.0000 mg | ORAL_CAPSULE | Freq: Every day | ORAL | 0 refills | Status: DC

## 2017-11-23 NOTE — Telephone Encounter (Signed)
...  ok sent Vyvanse 30 days. Will be due for followup before next refill - can schedule appt

## 2017-11-23 NOTE — Telephone Encounter (Signed)
Wayne Newman agreed to go to a psychiatrist. He would like a refill of the Adderall.

## 2017-11-23 NOTE — Telephone Encounter (Signed)
Left message advising or recommendations.  

## 2017-11-23 NOTE — Addendum Note (Signed)
Addended by: Deirdre PippinsALEXANDER, Emrey Thornley M on: 11/23/2017 01:13 PM   Modules accepted: Orders

## 2017-11-23 NOTE — Telephone Encounter (Signed)
Maxim called back and states the Vyvanse did help the most and would like to go back on the Vyvanse. Please advise. He would rather not go see a psychiatrist.

## 2017-11-30 ENCOUNTER — Telehealth: Payer: Self-pay

## 2017-11-30 NOTE — Telephone Encounter (Signed)
Pt left msg on triage VM asking if either he can have some Adderall called-in in addition to his Vyvanse 70mg  or if he can take two Vyvanase daily.   Called pt back and left msg advising him that Vyvanse is only written to take one daily and if that is not working- he needs another office visit. Gave pt call back information to schedule an appt but let him know that no additional medication nor any medication changes will be made until appt with provider.   Note to Dr Lyn HollingsheadAlexander

## 2017-12-26 ENCOUNTER — Ambulatory Visit (INDEPENDENT_AMBULATORY_CARE_PROVIDER_SITE_OTHER): Payer: 59 | Admitting: Osteopathic Medicine

## 2017-12-26 ENCOUNTER — Encounter: Payer: Self-pay | Admitting: Osteopathic Medicine

## 2017-12-26 VITALS — BP 131/72 | HR 86 | Temp 97.8°F | Wt 195.1 lb

## 2017-12-26 DIAGNOSIS — F988 Other specified behavioral and emotional disorders with onset usually occurring in childhood and adolescence: Secondary | ICD-10-CM | POA: Diagnosis not present

## 2017-12-26 MED ORDER — AMPHETAMINE-DEXTROAMPHETAMINE 30 MG PO TABS: 30.0000 mg | ORAL_TABLET | Freq: Every day | ORAL | 0 refills | Status: DC

## 2017-12-26 MED ORDER — AMPHETAMINE-DEXTROAMPHET ER 30 MG PO CP24: 30.0000 mg | ORAL_CAPSULE | ORAL | 0 refills | Status: DC

## 2017-12-26 NOTE — Patient Instructions (Addendum)
Adderall   30 mg XR capsule in AM  30 mg immediate release tablet in PM

## 2017-12-27 ENCOUNTER — Encounter: Payer: Self-pay | Admitting: Osteopathic Medicine

## 2017-12-27 NOTE — Progress Notes (Signed)
HPI: Wayne Newman is a 42 y.o. male who  has a past medical history of Thyroid disease.  he presents to [facility] today, 12/27/17,  for chief complaint of:  ADD  Have been a bit back-and-forth on his attention deficit disorder medications.  Vyvanse is not tolerable, he states he just does not feel right and this occasionally will give him chest pain when he was taking this with the Adderall.  He would like to get back on the Adderall, noted that this was wearing off a bit toward the end of the day so wonders if he could try twice per day dosing.     At today's visit... Past medical history, surgical history, and family history reviewed and updated as needed.  Current medication list and allergy/intolerance information reviewed and updated as needed. (See remainder of HPI, ROS, Phys Exam below)          ASSESSMENT/PLAN: The encounter diagnosis was Attention deficit disorder, unspecified hyperactivity presence.   No orders of the defined types were placed in this encounter.    Meds ordered this encounter  Medications  . amphetamine-dextroamphetamine (ADDERALL XR) 30 MG 24 hr capsule    Sig: Take 1 capsule (30 mg total) by mouth every morning.    Dispense:  30 capsule    Refill:  0  . amphetamine-dextroamphetamine (ADDERALL) 30 MG tablet    Sig: Take 1 tablet by mouth daily.    Dispense:  30 tablet    Refill:  0    Patient Instructions  Adderall   30 mg XR capsule in AM  30 mg immediate release tablet in PM     Follow-up plan: Return for recheck depending on response to medicines - please call/message [place] as directed .                             ############################################ ############################################ ############################################ ############################################    Current Meds  Medication Sig  . SYNTHROID 125 MCG tablet TAKE 2  TABLETS BY MOUTH DAILY BEFORE BREAKFAST  . [DISCONTINUED] lisdexamfetamine (VYVANSE) 70 MG capsule Take 1 capsule (70 mg total) by mouth daily.    Allergies  Allergen Reactions  . Sulfur Rash and Other (See Comments)    Had a sulfur abx for pink eye and made eyes more irritated and red   . Sulfa Antibiotics Other (See Comments)    Given for pink eye & irritated eye more.       Review of Systems:  Constitutional: No recent illness  HEENT: No  headache, no vision change  Cardiac: No  chest pain, No  pressure, No palpitations  Respiratory:  No  shortness of breath. No  Cough  Neurologic: No  weakness, No  Dizziness  Psychiatric: No  concerns with depression, No  concerns with anxiety  Exam:  BP 131/72 (BP Location: Left Arm, Patient Position: Sitting, Cuff Size: Normal)   Pulse 86   Temp 97.8 F (36.6 C) (Oral)   Wt 195 lb 1.6 oz (88.5 kg)   BMI 26.46 kg/m   Constitutional: VS see above. General Appearance: alert, well-developed, well-nourished, NAD  Eyes: Normal lids and conjunctive, non-icteric sclera  Ears, Nose, Mouth, Throat: MMM, Normal external inspection ears/nares/mouth/lips/gums.  Neck: No masses, trachea midline.   Respiratory: Normal respiratory effort. no wheeze, no rhonchi, no rales  Cardiovascular: S1/S2 normal, no murmur, no rub/gallop auscultated. RRR.   Musculoskeletal: Gait normal. Symmetric and independent movement  of all extremities  Neurological: Normal balance/coordination. No tremor.  Skin: warm, dry, intact.   Psychiatric: Normal judgment/insight. Normal mood and affect. Oriented x3.       Visit summary with medication list and pertinent instructions was printed for patient to review, patient was advised to alert us if any updates are needed. All questions at time of visit were answered - patient instructed to contact office with any additional concerns. ER/RTC precautions were reviewed with the patient and understanding verbalized.     Please note: voice recognition software was used to produce this document, and typos may escape review. Please contact Dr. Lyn HollingsheadAlexander for any needed clarifications.    Follow up plan: Return for recheck depending on response to medicines - please call/message us as directed .

## 2018-01-16 ENCOUNTER — Other Ambulatory Visit: Payer: Self-pay | Admitting: Osteopathic Medicine

## 2018-01-16 NOTE — Telephone Encounter (Signed)
Left a detailed vm msg for pt regarding med refill sent to local pharmacy. Call back info provided. 

## 2018-01-16 NOTE — Telephone Encounter (Signed)
[facility] requesting med refill for synthroid. Last thyroid lab check was 07/08/17. Pls advise, thanks.

## 2018-01-26 ENCOUNTER — Other Ambulatory Visit: Payer: Self-pay

## 2018-01-26 NOTE — Telephone Encounter (Signed)
Wayne Newman states the current dose of the Adderall works very well for him. He would like to continue taking the current dose. He requests a refill.

## 2018-01-27 MED ORDER — AMPHETAMINE-DEXTROAMPHET ER 30 MG PO CP24: 30.0000 mg | ORAL_CAPSULE | ORAL | 0 refills | Status: DC

## 2018-01-27 MED ORDER — AMPHETAMINE-DEXTROAMPHETAMINE 30 MG PO TABS: 30.0000 mg | ORAL_TABLET | Freq: Every day | ORAL | 0 refills | Status: DC

## 2018-01-30 ENCOUNTER — Telehealth: Payer: Self-pay | Admitting: Osteopathic Medicine

## 2018-01-30 ENCOUNTER — Encounter: Payer: Self-pay | Admitting: Osteopathic Medicine

## 2018-01-30 NOTE — Telephone Encounter (Signed)
Patient called and stated that he was not able to pick up his Adderall and that he has new insurance card. I advised him to send a copy via Mychart or he could bring a copy to scan in his chart. I advised him that we would need the updated card to work on a PA. Patient voices understanding and requested a new code be sent by his phone so he can upload this via Mychart. New code was sent per patient request.

## 2018-02-02 NOTE — Telephone Encounter (Signed)
Received fax from [facility] that Adderall was approved from 01/30/2018 through 02/02/2021.  Pharmacy notified and form sent to scan.

## 2018-02-27 ENCOUNTER — Encounter: Payer: Self-pay | Admitting: Osteopathic Medicine

## 2018-02-28 MED ORDER — AMPHETAMINE-DEXTROAMPHETAMINE 30 MG PO TABS: 30.0000 mg | ORAL_TABLET | Freq: Every day | ORAL | 0 refills | Status: DC

## 2018-02-28 MED ORDER — AMPHETAMINE-DEXTROAMPHET ER 30 MG PO CP24: 30.0000 mg | ORAL_CAPSULE | ORAL | 0 refills | Status: DC

## 2018-03-30 ENCOUNTER — Encounter: Payer: Self-pay | Admitting: Osteopathic Medicine

## 2018-03-31 MED ORDER — AMPHETAMINE-DEXTROAMPHET ER 30 MG PO CP24: 30.0000 mg | ORAL_CAPSULE | ORAL | 0 refills | Status: DC

## 2018-03-31 MED ORDER — AMPHETAMINE-DEXTROAMPHETAMINE 30 MG PO TABS: 30.0000 mg | ORAL_TABLET | Freq: Every day | ORAL | 0 refills | Status: DC

## 2018-05-02 ENCOUNTER — Encounter: Payer: Self-pay | Admitting: Osteopathic Medicine

## 2018-05-03 ENCOUNTER — Other Ambulatory Visit: Payer: Self-pay | Admitting: Osteopathic Medicine

## 2018-05-03 MED ORDER — AMPHETAMINE-DEXTROAMPHET ER 30 MG PO CP24: 30.0000 mg | ORAL_CAPSULE | ORAL | 0 refills | Status: DC

## 2018-05-03 MED ORDER — AMPHETAMINE-DEXTROAMPHETAMINE 30 MG PO TABS: 30.0000 mg | ORAL_TABLET | Freq: Every day | ORAL | 0 refills | Status: DC

## 2018-05-03 NOTE — Telephone Encounter (Signed)
Can follow up 6 mos from previous visit 12/2017, I think he'll be due for annual

## 2018-05-03 NOTE — Telephone Encounter (Signed)
[facility] requesting med refill for synthroid. Last thyroid check was 07/08/17, wnl. Pls advise, thanks.

## 2018-05-04 NOTE — Telephone Encounter (Signed)
Left a detailed vm msg for pt regarding med refill sent to pharmacy. Direct call back info provided. 

## 2018-06-01 ENCOUNTER — Encounter: Payer: Self-pay | Admitting: Osteopathic Medicine

## 2018-06-02 MED ORDER — AMPHETAMINE-DEXTROAMPHET ER 30 MG PO CP24: 30.0000 mg | ORAL_CAPSULE | ORAL | 0 refills | Status: DC

## 2018-06-02 MED ORDER — AMPHETAMINE-DEXTROAMPHETAMINE 30 MG PO TABS: 30.0000 mg | ORAL_TABLET | Freq: Every day | ORAL | 0 refills | Status: DC

## 2018-07-03 ENCOUNTER — Ambulatory Visit (INDEPENDENT_AMBULATORY_CARE_PROVIDER_SITE_OTHER): Payer: 59 | Admitting: Osteopathic Medicine

## 2018-07-03 ENCOUNTER — Encounter: Payer: Self-pay | Admitting: Osteopathic Medicine

## 2018-07-03 VITALS — Temp 98.6°F | Wt 185.0 lb

## 2018-07-03 DIAGNOSIS — E039 Hypothyroidism, unspecified: Secondary | ICD-10-CM

## 2018-07-03 DIAGNOSIS — R748 Abnormal levels of other serum enzymes: Secondary | ICD-10-CM

## 2018-07-03 DIAGNOSIS — F988 Other specified behavioral and emotional disorders with onset usually occurring in childhood and adolescence: Secondary | ICD-10-CM

## 2018-07-03 MED ORDER — LEVOTHYROXINE SODIUM 125 MCG PO TABS: 250.0000 ug | ORAL_TABLET | Freq: Every day | ORAL | 1 refills | Status: DC

## 2018-07-03 MED ORDER — AMPHETAMINE-DEXTROAMPHETAMINE 30 MG PO TABS: 30.0000 mg | ORAL_TABLET | Freq: Two times a day (BID) | ORAL | 0 refills | Status: DC

## 2018-07-03 NOTE — Progress Notes (Signed)
Virtual Visit via Video (App used: Doximity) Note  I connected with      Wayne Newman on 07/03/18 at 11:13 AM  by a telemedicine application and verified that I am speaking with the correct person using two identifiers.  Patient is at home I am in office    I discussed the limitations of evaluation and management by telemedicine and the availability of in person appointments. The patient expressed understanding and agreed to proceed.  History of Present Illness: Wayne BoastRyan Stcharles is a 43 y.o. male who would like to discuss ADHD follow-up and medication refills     Has been about a year since previous labs done, no concerns.   Taking Adderall XR 30 in AM and IR 30 in PM and doing well with this. Would like to try the IR bid as the XR is expensive.       Observations/Objective: Temp 98.6 F (37 C) (Oral)   Wt 185 lb (83.9 kg)   BMI 25.09 kg/m  BP Readings from Last 3 Encounters:  12/26/17 131/72  09/20/17 112/68  08/09/17 116/74   Exam: Normal Speech.  NAD  Lab and Radiology Results No results found for this or any previous visit (from the past 72 hour(s)). No results found.     Assessment and Plan: 43 y.o. male with The primary encounter diagnosis was Attention deficit disorder, unspecified hyperactivity presence. Diagnoses of Elevated liver enzymes and Hypothyroidism, unspecified type were also pertinent to this visit.   PDMP not reviewed this encounter. Orders Placed This Encounter  Procedures  . CBC  . COMPLETE METABOLIC PANEL WITH GFR  . Lipid panel  . TSH   Meds ordered this encounter  Medications  . amphetamine-dextroamphetamine (ADDERALL) 30 MG tablet    Sig: Take 1 tablet by mouth 2 (two) times daily.    Dispense:  60 tablet    Refill:  0  . levothyroxine (SYNTHROID) 125 MCG tablet    Sig: Take 2 tablets (250 mcg total) by mouth daily before breakfast. Generic please and thanks!    Dispense:  180 tablet    Refill:  1   There are no Patient  Instructions on file for this visit.  Instructions sent via MyChart. If MyChart not available, pt was given option for info via personal e-mail w/ no guarantee of protected health info over unsecured e-mail communication, and MyChart sign-up instructions were included.   Follow Up Instructions: Return in about 6 months (around 01/02/2019) for annual physical and refills, mychart message / see me sooner than that as needed .    I discussed the assessment and treatment plan with the patient. The patient was provided an opportunity to ask questions and all were answered. The patient agreed with the plan and demonstrated an understanding of the instructions.   The patient was advised to call back or seek an in-person evaluation if any new concerns, if symptoms worsen or if the condition fails to improve as anticipated.  15 minutes of non-face-to-face time was provided during this encounter.                      Historical information moved to improve visibility of documentation.  Past Medical History:  Diagnosis Date  . Thyroid disease    History reviewed. No pertinent surgical history. Social History   Tobacco Use  . Smoking status: Never Smoker  . Smokeless tobacco: Never Used  Substance Use Topics  . Alcohol use: Yes   family  history includes Alcohol abuse in his unknown relative; Heart attack in his unknown relative; Stroke in his unknown relative.  Medications: Current Outpatient Medications  Medication Sig Dispense Refill  . amphetamine-dextroamphetamine (ADDERALL) 30 MG tablet Take 1 tablet by mouth 2 (two) times daily. 60 tablet 0  . levothyroxine (SYNTHROID) 125 MCG tablet Take 2 tablets (250 mcg total) by mouth daily before breakfast. Generic please and thanks! 180 tablet 1   No current facility-administered medications for this visit.    Allergies  Allergen Reactions  . Sulfur Rash and Other (See Comments)    Had a sulfur abx for pink eye and made  eyes more irritated and red   . Sulfa Antibiotics Other (See Comments)    Given for pink eye & irritated eye more.    PDMP not reviewed this encounter. Orders Placed This Encounter  Procedures  . CBC  . COMPLETE METABOLIC PANEL WITH GFR  . Lipid panel  . TSH   Meds ordered this encounter  Medications  . amphetamine-dextroamphetamine (ADDERALL) 30 MG tablet    Sig: Take 1 tablet by mouth 2 (two) times daily.    Dispense:  60 tablet    Refill:  0  . levothyroxine (SYNTHROID) 125 MCG tablet    Sig: Take 2 tablets (250 mcg total) by mouth daily before breakfast. Generic please and thanks!    Dispense:  180 tablet    Refill:  1

## 2018-07-03 NOTE — Progress Notes (Signed)
Called pt at 1059 am, no answer. Left a vm msg.

## 2018-08-03 ENCOUNTER — Encounter: Payer: Self-pay | Admitting: Osteopathic Medicine

## 2018-08-03 MED ORDER — AMPHETAMINE-DEXTROAMPHETAMINE 30 MG PO TABS: 30.0000 mg | ORAL_TABLET | Freq: Two times a day (BID) | ORAL | 0 refills | Status: DC

## 2018-08-13 ENCOUNTER — Encounter: Payer: Self-pay | Admitting: Osteopathic Medicine

## 2018-08-14 ENCOUNTER — Encounter: Payer: Self-pay | Admitting: Osteopathic Medicine

## 2018-08-15 NOTE — Telephone Encounter (Signed)
Needs appt w/ sports med for elbow problem, or can see me if he really wants

## 2018-08-15 NOTE — Telephone Encounter (Signed)
Can you please call him and schedule him w/ Georgina Snell or T? Thanks

## 2018-08-16 ENCOUNTER — Encounter: Payer: Self-pay | Admitting: Family Medicine

## 2018-08-16 ENCOUNTER — Other Ambulatory Visit: Payer: Self-pay

## 2018-08-16 ENCOUNTER — Ambulatory Visit (INDEPENDENT_AMBULATORY_CARE_PROVIDER_SITE_OTHER): Payer: Managed Care, Other (non HMO) | Admitting: Family Medicine

## 2018-08-16 VITALS — BP 121/72 | HR 78 | Temp 98.1°F | Wt 185.0 lb

## 2018-08-16 DIAGNOSIS — L03114 Cellulitis of left upper limb: Secondary | ICD-10-CM | POA: Diagnosis not present

## 2018-08-16 MED ORDER — CLINDAMYCIN HCL 300 MG PO CAPS: 300.0000 mg | ORAL_CAPSULE | Freq: Three times a day (TID) | ORAL | 0 refills | Status: DC

## 2018-08-16 MED ORDER — DOXYCYCLINE HYCLATE 100 MG PO TABS: 100.0000 mg | ORAL_TABLET | Freq: Two times a day (BID) | ORAL | 0 refills | Status: DC

## 2018-08-16 NOTE — Progress Notes (Signed)
Wayne Newman is a 43 y.o. male who presents to [facility] today for left elbow pain and swelling  Present less than 1 week.  Patient notes pain and swelling of his elbow.  He was doing some tubing and cannot recall any specific injuries but may have suffered an abrasion or puncture to the skin of the elbow while tubing.  He has tried some over-the-counter medications which have not helped much.  He thinks that his symptoms are slightly improving.  No fevers chills nausea vomiting or diarrhea.  ROS:  As above  Exam:  BP 121/72    Pulse 78    Temp 98.1 F (36.7 C) (Oral)    Wt 185 lb (83.9 kg)    BMI 25.09 kg/m  Wt Readings from Last 5 Encounters:  08/16/18 185 lb (83.9 kg)  07/03/18 185 lb (83.9 kg)  12/26/17 195 lb 1.6 oz (88.5 kg)  09/20/17 189 lb 8 oz (86 kg)  08/09/17 192 lb 11.2 oz (87.4 kg)   General: Well Developed, well nourished, and in no acute distress.  Neuro/Psych: Alert and oriented x3, extra-ocular muscles intact, able to move all 4 extremities, sensation grossly intact. Skin: Warm and dry, no rashes noted.  Respiratory: Not using accessory muscles, speaking in full sentences, trachea midline.  Cardiovascular: Pulses palpable, no extremity edema. Abdomen: Does not appear distended. MSK: Left extensor elbow erythematous with some crust.  Mildly tender to palpation.  No fluctuance or large fluid cyst palpated.  Normal elbow motion.  Pulses cap refill and sensation are intact distally.      Lab and Radiology Results No results found for this or any previous visit (from the past 72 hour(s)). No results found.     Assessment and Plan: 43 y.o. male with left elbow cellulitis.  No significant olecranon bursitis at this time.  Plan for treatment with oral antibiotics.  Will use doxycycline and clindamycin.  Would like to extend coverage to include anaerobes given [place]  Recheck  if not improving.  Precautions reviewed.   PDMP not reviewed this encounter. No orders of the defined types were placed in this encounter.  Meds ordered this encounter  Medications   doxycycline (VIBRA-TABS) 100 MG tablet    Sig: Take 1 tablet (100 mg total) by mouth 2 (two) times daily.    Dispense:  14 tablet    Refill:  0   clindamycin (CLEOCIN) 300 MG capsule    Sig: Take 1 capsule (300 mg total) by mouth 3 (three) times daily.    Dispense:  21 capsule    Refill:  0    Historical information moved to improve visibility of documentation.  Past Medical History:  Diagnosis Date   Thyroid disease    No past surgical history on file. Social History   Tobacco Use   Smoking status: Never Smoker   Smokeless tobacco: Never Used  Substance Use Topics   Alcohol use: Yes   family history includes Alcohol abuse in his unknown relative; Heart attack in his unknown relative; Stroke in his unknown relative.  Medications: Current Outpatient Medications  Medication Sig Dispense Refill   amphetamine-dextroamphetamine (ADDERALL) 30 MG tablet Take 1 tablet by mouth 2 (two) times daily. 60 tablet 0   clindamycin (CLEOCIN) 300 MG capsule Take 1 capsule (300 mg total) by mouth 3 (three) times daily. 21 capsule 0   doxycycline (VIBRA-TABS) 100 MG tablet Take 1 tablet (  100 mg total) by mouth 2 (two) times daily. 14 tablet 0   levothyroxine (SYNTHROID) 125 MCG tablet Take 2 tablets (250 mcg total) by mouth daily before breakfast. Generic please and thanks! 180 tablet 1   No current facility-administered medications for this visit.    Allergies  Allergen Reactions   Sulfur Rash and Other (See Comments)    Had a sulfur abx for pink eye and made eyes more irritated and red    Sulfa Antibiotics Other (See Comments)    Given for pink eye & irritated eye more.      Discussed warning signs or symptoms. Please see discharge instructions. Patient expresses understanding.

## 2018-08-16 NOTE — Patient Instructions (Signed)
Thank you for coming in today. Take both doxycycline and clindamycin.  Do not take doxycycline with iron or calcium (dairy).  If not improving let me know.  It should improve quickly with the correct antibiotic.    Cellulitis, Adult  Cellulitis is a skin infection. The infected area is usually warm, red, swollen, and tender. This condition occurs most often in the arms and lower legs. The infection can travel to the muscles, blood, and underlying tissue and become serious. It is very important to get treated for this condition. What are the causes? Cellulitis is caused by bacteria. The bacteria enter through a break in the skin, such as a cut, burn, insect bite, open sore, or crack. What increases the risk? This condition is more likely to occur in people who:  Have a weak body defense system (immune system).  Have open wounds on the skin, such as cuts, burns, bites, and scrapes. Bacteria can enter the body through these open wounds.  Are older than 42 years of age.  Have diabetes.  Have a type of long-lasting (chronic) liver disease (cirrhosis) or kidney disease.  Are obese.  Have a skin condition such as: ? Itchy rash (eczema). ? Slow movement of blood in the veins (venous stasis). ? Fluid buildup below the skin (edema).  Have had radiation therapy.  Use IV drugs. What are the signs or symptoms? Symptoms of this condition include:  Redness, streaking, or spotting on the skin.  Swollen area of the skin.  Tenderness or pain when an area of the skin is touched.  Warm skin.  A fever.  Chills.  Blisters. How is this diagnosed? This condition is diagnosed based on a medical history and physical exam. You may also have tests, including:  Blood tests.  Imaging tests. How is this treated? Treatment for this condition may include:  Medicines, such as antibiotic medicines or medicines to treat allergies (antihistamines).  Supportive care, such as rest and  application of cold or warm cloths (compresses) to the skin.  Hospital care, if the condition is severe. The infection usually starts to get better within 1-2 days of treatment. Follow these instructions at home:  Medicines  Take over-the-counter and prescription medicines only as told by your health care provider.  If you were prescribed an antibiotic medicine, take it as told by your health care provider. Do not stop taking the antibiotic even if you start to feel better. General instructions  Drink enough fluid to keep your urine pale yellow.  Do not touch or rub the infected area.  Raise (elevate) the infected area above the level of your heart while you are sitting or lying down.  Apply warm or cold compresses to the affected area as told by your health care provider.  Keep all follow-up visits as told by your health care provider. This is important. These visits let your health care provider make sure a more serious infection is not developing. Contact a health care provider if:  You have a fever.  Your symptoms do not begin to improve within 1-2 days of starting treatment.  Your bone or joint underneath the infected area becomes painful after the skin has healed.  Your infection returns in the same area or another area.  You notice a swollen bump in the infected area.  You develop new symptoms.  You have a general ill feeling (malaise) with muscle aches and pains. Get help right away if:  Your symptoms get worse.  You feel very  sleepy.  You develop vomiting or diarrhea that persists.  You notice red streaks coming from the infected area.  Your red area gets larger or turns dark in color. These symptoms may represent a serious problem that is an emergency. Do not wait to see if the symptoms will go away. Get medical help right away. Call your local emergency services (911 in the [place] Do not drive yourself to the hospital. Summary  Cellulitis is a skin  infection. This condition occurs most often in the arms and lower legs.  Treatment for this condition may include medicines, such as antibiotic medicines or antihistamines.  Take over-the-counter and prescription medicines only as told by your health care provider. If you were prescribed an antibiotic medicine, do not stop taking the antibiotic even if you start to feel better.  Contact a health care provider if your symptoms do not begin to improve within 1-2 days of starting treatment or your symptoms get worse.  Keep all follow-up visits as told by your health care provider. This is important. These visits let your health care provider make sure that a more serious infection is not developing. This information is not intended to replace advice given to you by your health care provider. Make sure you discuss any questions you have with your health care provider. Document Released: 10/07/2004 Document Revised: 05/19/2017 Document Reviewed: 05/19/2017 Elsevier Patient Education  2020 [employer]   Elbow Bursitis  Bursitis is swelling and pain at the tip of the elbow. This happens when fluid builds up in a sac under the skin (bursa). This may also be called olecranon bursitis. What are the causes? Elbow bursitis may be caused by:  Elbow injury, such as falling onto the elbow.  Leaning on hard surfaces for long periods of time.  Infection from an injury that breaks the skin near the elbow.  A bone growth (spur) that forms at the tip of the elbow.  A medical condition that causes inflammation, such as gout or rheumatoid arthritis. Sometimes the cause is not known. What are the signs or symptoms? The first sign of elbow bursitis is usually swelling at the tip of the elbow. This can grow to be about the size of a golf ball. Swelling may start suddenly or develop gradually. Other symptoms may include:  Pain when bending or leaning on the elbow.  Not being able to move the elbow  normally. If bursitis is caused by an infection, you may have:  Redness, warmth, and tenderness of the elbow.  Drainage of pus from the swollen area over the elbow, if the skin breaks open. How is this diagnosed? This condition may be diagnosed based on:  Your symptoms and medical history.  Any recent injuries you have had.  A physical exam.  X-rays to check for a bone spur or fracture.  Draining fluid from the bursa to test it for infection.  Blood tests to rule out gout or rheumatoid arthritis. How is this treated? Treatment for elbow bursitis depends on the cause. Treatment may include:  Medicines. These may include: ? Over-the-counter medicines to relieve pain and inflammation. ? Antibiotic medicines. ? Injections of anti-inflammatory medicines (steroids).  Draining fluid from the bursa.  Wrapping your elbow with a bandage.  Wearing elbow pads. If these treatments do not help, you may need surgery to remove the bursa. Follow these instructions at home: Medicines  Take over-the-counter and prescription medicines only as told by your health care provider.  If you were prescribed  an antibiotic medicine, take it as told by your health care provider. Do not stop taking the antibiotic even if you start to feel better. Managing pain, stiffness, and swelling   If directed, put ice on your elbow: ? Put ice in a plastic bag. ? Place a towel between your skin and the bag. ? Leave the ice on for 20 minutes, 2-3 times a day.  If your bursitis is caused by an injury, rest your elbow and wear your bandage as told by your health care provider.  Use elbow pads or elbow wraps to cushion your elbow as needed. General instructions  Avoid any activities that cause elbow pain. Ask your health care provider what activities are safe for you.  Keep all follow-up visits as told by your health care provider. This is important. Contact a health care provider if you have:  A fever.   Symptoms that do not get better with treatment.  Pain or swelling that: ? Gets worse. ? Goes away and then comes back.  Pus draining from your elbow. Get help right away if you have:  Trouble moving your arm, hand, or fingers. Summary  Elbow bursitis is inflammation of the fluid-filled sac (bursa) between the tip of your elbow bone (olecranon) and your skin.  Treatment for elbow bursitis depends on the cause. It may include medicines to relieve pain and inflammation, antibiotic medicines, and draining fluid from your elbow.  Contact a health care provider if your symptoms do not get better with treatment, or if your symptoms go away and then come back. This information is not intended to replace advice given to you by your health care provider. Make sure you discuss any questions you have with your health care provider. Document Released: 01/27/2006 Document Revised: 12/10/2016 Document Reviewed: 12/07/2016 Elsevier Patient Education  2020 [employer].

## 2018-08-23 ENCOUNTER — Encounter: Payer: Self-pay | Admitting: Family Medicine

## 2018-08-28 ENCOUNTER — Ambulatory Visit (INDEPENDENT_AMBULATORY_CARE_PROVIDER_SITE_OTHER): Payer: Managed Care, Other (non HMO) | Admitting: Family Medicine

## 2018-08-28 ENCOUNTER — Encounter: Payer: Self-pay | Admitting: Family Medicine

## 2018-08-28 ENCOUNTER — Other Ambulatory Visit: Payer: Self-pay

## 2018-08-28 VITALS — BP 116/71 | HR 82 | Temp 98.1°F | Wt 185.1 lb

## 2018-08-28 DIAGNOSIS — M7022 Olecranon bursitis, left elbow: Secondary | ICD-10-CM

## 2018-08-28 MED ORDER — DOXYCYCLINE HYCLATE 100 MG PO TABS: 100.0000 mg | ORAL_TABLET | Freq: Two times a day (BID) | ORAL | 0 refills | Status: DC

## 2018-08-28 MED ORDER — CLINDAMYCIN HCL 300 MG PO CAPS: 300.0000 mg | ORAL_CAPSULE | Freq: Three times a day (TID) | ORAL | 0 refills | Status: DC

## 2018-08-28 NOTE — Patient Instructions (Signed)
Thank you for coming in today. Restart antibiotics this time for 2 weeks.  If the redness and swelling return we will drain it.  Protect the elbow from contact or impact. Consider compressive elbow sleeve. Body Helix Full elbow is good.   Recheck as needed. Keep me updated.     Elbow Bursitis  Bursitis is swelling and pain at the tip of the elbow. This happens when fluid builds up in a sac under the skin (bursa). This may also be called olecranon bursitis. What are the causes? Elbow bursitis may be caused by:  Elbow injury, such as falling onto the elbow.  Leaning on hard surfaces for long periods of time.  Infection from an injury that breaks the skin near the elbow.  A bone growth (spur) that forms at the tip of the elbow.  A medical condition that causes inflammation, such as gout or rheumatoid arthritis. Sometimes the cause is not known. What are the signs or symptoms? The first sign of elbow bursitis is usually swelling at the tip of the elbow. This can grow to be about the size of a golf ball. Swelling may start suddenly or develop gradually. Other symptoms may include:  Pain when bending or leaning on the elbow.  Not being able to move the elbow normally. If bursitis is caused by an infection, you may have:  Redness, warmth, and tenderness of the elbow.  Drainage of pus from the swollen area over the elbow, if the skin breaks open. How is this diagnosed? This condition may be diagnosed based on:  Your symptoms and medical history.  Any recent injuries you have had.  A physical exam.  X-rays to check for a bone spur or fracture.  Draining fluid from the bursa to test it for infection.  Blood tests to rule out gout or rheumatoid arthritis. How is this treated? Treatment for elbow bursitis depends on the cause. Treatment may include:  Medicines. These may include: ? Over-the-counter medicines to relieve pain and inflammation. ? Antibiotic medicines. ?  Injections of anti-inflammatory medicines (steroids).  Draining fluid from the bursa.  Wrapping your elbow with a bandage.  Wearing elbow pads. If these treatments do not help, you may need surgery to remove the bursa. Follow these instructions at home: Medicines  Take over-the-counter and prescription medicines only as told by your health care provider.  If you were prescribed an antibiotic medicine, take it as told by your health care provider. Do not stop taking the antibiotic even if you start to feel better. Managing pain, stiffness, and swelling   If directed, put ice on your elbow: ? Put ice in a plastic bag. ? Place a towel between your skin and the bag. ? Leave the ice on for 20 minutes, 2-3 times a day.  If your bursitis is caused by an injury, rest your elbow and wear your bandage as told by your health care provider.  Use elbow pads or elbow wraps to cushion your elbow as needed. General instructions  Avoid any activities that cause elbow pain. Ask your health care provider what activities are safe for you.  Keep all follow-up visits as told by your health care provider. This is important. Contact a health care provider if you have:  A fever.  Symptoms that do not get better with treatment.  Pain or swelling that: ? Gets worse. ? Goes away and then comes back.  Pus draining from your elbow. Get help right away if you have:  Trouble  moving your arm, hand, or fingers. Summary  Elbow bursitis is inflammation of the fluid-filled sac (bursa) between the tip of your elbow bone (olecranon) and your skin.  Treatment for elbow bursitis depends on the cause. It may include medicines to relieve pain and inflammation, antibiotic medicines, and draining fluid from your elbow.  Contact a health care provider if your symptoms do not get better with treatment, or if your symptoms go away and then come back. This information is not intended to replace advice given to you  by your health care provider. Make sure you discuss any questions you have with your health care provider. Document Released: 01/27/2006 Document Revised: 12/10/2016 Document Reviewed: 12/07/2016 Elsevier Patient Education  2020 [employer]

## 2018-08-28 NOTE — Progress Notes (Signed)
Wayne BoastRyan Newman is a 43 y.o. male who [facility] today for follow-up left elbow cellulitis/olecranon bursitis.  Patient was seen on August 5 for left elbow cellulitis versus possible early olecranon bursitis.  He was treated with clindamycin and doxycycline and had significant symptom improvement.  He notes he still has a little area of redness and tenderness at the tip of his elbow.  He stopped his antibiotics about 5 days ago and notes that it did not worsen but not improved.  Symptoms are mild.  He feels well otherwise with no fevers chills nausea vomiting or diarrhea.   ROS as above:  Exam:  BP 116/71 (BP Location: Left Arm, Patient Position: Sitting, Cuff Size: Normal)    Pulse 82    Temp 98.1 F (36.7 C) (Oral)    Wt 185 lb 1.6 oz (84 kg)    BMI 25.10 kg/m  Wt Readings from Last 5 Encounters:  08/28/18 185 lb 1.6 oz (84 kg)  08/16/18 185 lb (83.9 kg)  07/03/18 185 lb (83.9 kg)  12/26/17 195 lb 1.6 oz (88.5 kg)  09/20/17 189 lb 8 oz (86 kg)    Gen: Well NAD HEENT: EOMI,  MMM Lungs: Normal work of breathing. CTABL Heart: RRR no MRG Abd: NABS, Soft. Nondistended, Nontender Exts: Brisk capillary refill, warm and well perfused.  Left elbow small area of erythema with mild tenderness.  No palpable fluctuance.  No discharge.  No crusting or induration.  Lab and Radiology Results Limited musculoskeletal ultrasound left olecranon reveals normal-appearing bone.  Small hypoechoic fluid collection in the subcutaneous space measuring about 1.5 mm in height by about 4 mm long.   Assessment and Plan: 43 y.o. male with left elbow redness and pain.  Very likely olecranon bursitis at this point with some infective component.  The bursa sac is tiny on ultrasound.  He has not worsened but not improved after completing antibiotic course.  Plan to extend antibiotics for  a total of 2 weeks.  If at that time he still not had resolution we will proceed with incision and drainage in office.  Additionally recommend compressive elbow sleeve and protecting the elbow from impact.  Recheck as needed.  PDMP not reviewed this encounter. No orders of the defined types were placed in this encounter.  No orders of the defined types were placed in this encounter.    Historical information moved to improve visibility of documentation.  Past Medical History:  Diagnosis Date   Thyroid disease    No past surgical history on file. Social History   Tobacco Use   Smoking status: Never Smoker   Smokeless tobacco: Never Used  Substance Use Topics   Alcohol use: Yes   family history includes Alcohol abuse in his unknown relative; Heart attack in his unknown relative; Stroke in his unknown relative.  Medications: Current Outpatient Medications  Medication Sig Dispense Refill   amphetamine-dextroamphetamine (ADDERALL) 30 MG tablet Take 1 tablet by mouth 2 (two) times daily. 60 tablet 0   clindamycin (CLEOCIN) 300 MG capsule Take 1 capsule (300 mg total) by mouth 3 (three) times daily. 21 capsule 0   levothyroxine (SYNTHROID) 125 MCG tablet Take 2 tablets (250 mcg total) by mouth daily before breakfast. Generic please and thanks! 180 tablet 1   doxycycline (VIBRA-TABS) 100 MG tablet Take 1 tablet (100 mg total) by mouth 2 (two) times daily. (Patient not taking: Reported on 08/28/2018) 14 tablet  0   No current facility-administered medications for this visit.    Allergies  Allergen Reactions   Sulfur Rash and Other (See Comments)    Had a sulfur abx for pink eye and made eyes more irritated and red    Sulfa Antibiotics Other (See Comments)    Given for pink eye & irritated eye more.     Discussed warning signs or symptoms. Please see discharge instructions. Patient expresses understanding.

## 2018-08-29 LAB — COMPLETE METABOLIC PANEL WITH GFR
AG Ratio: 2.3 (calc) (ref 1.0–2.5)
ALT: 49 U/L — ABNORMAL HIGH (ref 9–46)
AST: 36 U/L (ref 10–40)
Albumin: 4.5 g/dL (ref 3.6–5.1)
Alkaline phosphatase (APISO): 55 U/L (ref 36–130)
BUN: 18 mg/dL (ref 7–25)
CO2: 29 mmol/L (ref 20–32)
Calcium: 9.9 mg/dL (ref 8.6–10.3)
Chloride: 106 mmol/L (ref 98–110)
Creat: 0.94 mg/dL (ref 0.60–1.35)
GFR, Est African American: 116 mL/min/{1.73_m2} (ref >=60)
GFR, Est Non African American: 100 mL/min/{1.73_m2} (ref >=60)
Globulin: 2 g/dL (calc) (ref 1.9–3.7)
Glucose, Bld: 69 mg/dL (ref 65–99)
Potassium: 4.3 mmol/L (ref 3.5–5.3)
Sodium: 143 mmol/L (ref 135–146)
Total Bilirubin: 1 mg/dL (ref 0.2–1.2)
Total Protein: 6.5 g/dL (ref 6.1–8.1)

## 2018-08-29 LAB — CBC
HCT: 44.4 % (ref 38.5–50.0)
Hemoglobin: 15.2 g/dL (ref 13.2–17.1)
MCH: 32.1 pg (ref 27.0–33.0)
MCHC: 34.2 g/dL (ref 32.0–36.0)
MCV: 93.9 fL (ref 80.0–100.0)
MPV: 10.7 fL (ref 7.5–12.5)
Platelets: 209 10*3/uL (ref 140–400)
RBC: 4.73 10*6/uL (ref 4.20–5.80)
RDW: 11.9 % (ref 11.0–15.0)
WBC: 6.7 10*3/uL (ref 3.8–10.8)

## 2018-08-29 LAB — LIPID PANEL
Cholesterol: 164 mg/dL (ref <200)
HDL: 45 mg/dL (ref >=40)
LDL Cholesterol (Calc): 97 mg/dL (calc)
Non-HDL Cholesterol (Calc): 119 mg/dL (calc) (ref <130)
Total CHOL/HDL Ratio: 3.6 (calc) (ref <5.0)
Triglycerides: 128 mg/dL (ref <150)

## 2018-08-29 LAB — TSH: TSH: 12.76 mIU/L — ABNORMAL HIGH (ref 0.40–4.50)

## 2018-08-31 ENCOUNTER — Encounter: Payer: Self-pay | Admitting: Osteopathic Medicine

## 2018-09-01 ENCOUNTER — Other Ambulatory Visit: Payer: Self-pay | Admitting: Osteopathic Medicine

## 2018-09-01 DIAGNOSIS — E038 Other specified hypothyroidism: Secondary | ICD-10-CM

## 2018-09-01 MED ORDER — LEVOTHYROXINE SODIUM 137 MCG PO TABS: 274.0000 ug | ORAL_TABLET | Freq: Every day | ORAL | 0 refills | Status: DC

## 2018-09-04 ENCOUNTER — Encounter: Payer: Self-pay | Admitting: Osteopathic Medicine

## 2018-09-05 MED ORDER — AMPHETAMINE-DEXTROAMPHETAMINE 30 MG PO TABS: 30.0000 mg | ORAL_TABLET | Freq: Two times a day (BID) | ORAL | 0 refills | Status: DC

## 2018-09-13 ENCOUNTER — Ambulatory Visit (INDEPENDENT_AMBULATORY_CARE_PROVIDER_SITE_OTHER): Payer: Managed Care, Other (non HMO) | Admitting: Family Medicine

## 2018-09-13 ENCOUNTER — Other Ambulatory Visit: Payer: Self-pay

## 2018-09-13 ENCOUNTER — Ambulatory Visit (INDEPENDENT_AMBULATORY_CARE_PROVIDER_SITE_OTHER): Payer: Managed Care, Other (non HMO)

## 2018-09-13 ENCOUNTER — Encounter: Payer: Self-pay | Admitting: Family Medicine

## 2018-09-13 VITALS — BP 121/74 | HR 67 | Temp 98.2°F | Wt 188.0 lb

## 2018-09-13 DIAGNOSIS — M7022 Olecranon bursitis, left elbow: Secondary | ICD-10-CM

## 2018-09-13 MED ORDER — CLINDAMYCIN HCL 300 MG PO CAPS: 300.0000 mg | ORAL_CAPSULE | Freq: Three times a day (TID) | ORAL | 0 refills | Status: DC

## 2018-09-13 MED ORDER — DOXYCYCLINE HYCLATE 100 MG PO TABS: 100.0000 mg | ORAL_TABLET | Freq: Two times a day (BID) | ORAL | 0 refills | Status: DC

## 2018-09-13 NOTE — Patient Instructions (Addendum)
Thank you for coming in today. Call or go to the ER if you develop a large red swollen joint with extreme pain or oozing puss.  Continue compression.  Extend antibiotics for 1-2 weeks.  Recheck as needed.

## 2018-09-13 NOTE — Progress Notes (Signed)
Wayne Newman is a 43 y.o. male who presents to [place] today for follow-up olecranon bursitis.  Patient has been seen 2 times now for left elbow olecranon bursitis.  He was thought to have olecranon bursitis with overlying cellulitis.  He was treated with antibiotics.  He completed a two-week course today.  He notes the redness pain and swelling are significantly improved but he still has a tender nodule at the olecranon.  He denies any skin redness.  No fevers or chills.  He feels well otherwise.    ROS:  As above  Exam:  BP 121/74   Pulse 67   Temp 98.2 F (36.8 C) (Oral)   Wt 188 lb (85.3 kg)   BMI 25.50 kg/m   Wt Readings from Last 5 Encounters:  09/13/18 188 lb (85.3 kg)  08/28/18 185 lb 1.6 oz (84 kg)  08/16/18 185 lb (83.9 kg)  07/03/18 185 lb (83.9 kg)  12/26/17 195 lb 1.6 oz (88.5 kg)   General: Well Developed, well nourished, and in no acute distress.  Neuro/Psych: Alert and oriented x3, extra-ocular muscles intact, able to move all 4 extremities, sensation grossly intact. Skin: Warm and dry, no rashes noted.  Respiratory: Not using accessory muscles, speaking in full sentences, trachea midline.  Cardiovascular: Pulses palpable, no extremity edema. Abdomen: Does not appear distended. MSK: Left elbow: No skin erythema.  Mildly tender nonfluctuant nodule overlying olecranon.  Mobile. Normal elbow motion.  Intact strength.    Lab and Radiology Results X-ray images left elbow personally dependently reviewed. Relatively normal-appearing with no acute fractures or severe soft tissue swelling.  Small olecranon spur at the triceps insertion present. Await formal radiology review.  Limited musculoskeletal ultrasound examination does reveal increased Doppler activity at soft tissue overlying olecranon with tiny hypoechoic changes consistent with olecranon bursitis.  No marbling consistent with cellulitis present  Procedure:  Real-time Ultrasound Guided Injection of left olecranon bursa Device: GE Logiq E   Images permanently stored and available for review in the ultrasound unit. Verbal informed consent obtained.  Discussed risks and benefits of procedure. Warned about infection bleeding damage to structures skin hypopigmentation and fat atrophy among others. Patient expresses understanding and agreement Time-out conducted.   Noted no overlying erythema, induration, or other signs of local infection.   Skin prepped in a sterile fashion.   Local anesthesia: Topical Ethyl chloride.   With sterile technique and under real time ultrasound guidance:  40 mg of Depo-Medrol and 0.5 mL of Marcaine.  Total volume 1 mL injected easily.   Completed without difficulty   Pain immediately resolved suggesting accurate placement of the medication.   Advised to call if fevers/chills, erythema, induration, drainage, or persistent bleeding.   Images permanently stored and available for review in the ultrasound unit.  Impression: Technically successful ultrasound guided injection.       Assessment and Plan: 43 y.o. male with left elbow olecranon bursitis.  Patient had cellulitis with possible septic olecranon bursitis.  The infection appears to be resolved however he still has inflammation and soft tissue swelling.  No significant bursa sac present.  Discussed options.  Given his continued symptoms reasonable to seed with injection at this time.  Injection as above.  Continue compression and padding.  We will go and extend course of antibiotics for 1 to 2 weeks.  Recheck as needed.   PDMP not reviewed this encounter. Orders Placed This Encounter  Procedures  . DG Elbow Complete Left  Standing Status:   Future    Number of Occurrences:   1    Standing Expiration Date:   11/13/2019    Order Specific Question:   Reason for Exam (SYMPTOM  OR DIAGNOSIS REQUIRED)    Answer:   eval swelling nd pain    Order Specific Question:    Preferred imaging location?    Answer[facility]    Order Specific Question:   Radiology Contrast Protocol - do NOT remove file path    Answer:   [email or web address]   Meds ordered this encounter  Medications  . clindamycin (CLEOCIN) 300 MG capsule    Sig: Take 1 capsule (300 mg total) by mouth 3 (three) times daily.    Dispense:  42 capsule    Refill:  0  . doxycycline (VIBRA-TABS) 100 MG tablet    Sig: Take 1 tablet (100 mg total) by mouth 2 (two) times daily.    Dispense:  28 tablet    Refill:  0    Historical information moved to improve visibility of documentation.  Past Medical History:  Diagnosis Date  . Thyroid disease    No past surgical history on file. Social History   Tobacco Use  . Smoking status: Never Smoker  . Smokeless tobacco: Never Used  Substance Use Topics  . Alcohol use: Yes   family history includes Alcohol abuse in his unknown relative; Heart attack in his unknown relative; Stroke in his unknown relative.  Medications: Current Outpatient Medications  Medication Sig Dispense Refill  . amphetamine-dextroamphetamine (ADDERALL) 30 MG tablet Take 1 tablet by mouth 2 (two) times daily. 60 tablet 0  . clindamycin (CLEOCIN) 300 MG capsule Take 1 capsule (300 mg total) by mouth 3 (three) times daily. 42 capsule 0  . doxycycline (VIBRA-TABS) 100 MG tablet Take 1 tablet (100 mg total) by mouth 2 (two) times daily. 28 tablet 0  . levothyroxine (SYNTHROID) 137 MCG tablet Take 2 tablets (274 mcg total) by mouth daily before breakfast. Generic please and thanks! Labs due when pill bottle running low! 120 tablet 0   No current facility-administered medications for this visit.    Allergies  Allergen Reactions  . Sulfur Rash and Other (See Comments)    Had a sulfur abx for pink eye and made eyes more irritated and red   . Sulfa Antibiotics Other (See Comments)    Given for pink eye & irritated eye more.       Discussed warning signs or symptoms. Please see discharge instructions. Patient expresses understanding.

## 2018-10-05 ENCOUNTER — Encounter: Payer: Self-pay | Admitting: Family Medicine

## 2018-10-05 ENCOUNTER — Encounter: Payer: Self-pay | Admitting: Osteopathic Medicine

## 2018-10-06 MED ORDER — AMPHETAMINE-DEXTROAMPHETAMINE 30 MG PO TABS: 30.0000 mg | ORAL_TABLET | Freq: Two times a day (BID) | ORAL | 0 refills | Status: DC

## 2018-11-03 ENCOUNTER — Encounter: Payer: Self-pay | Admitting: Osteopathic Medicine

## 2018-11-03 MED ORDER — AMPHETAMINE-DEXTROAMPHETAMINE 30 MG PO TABS: 30.0000 mg | ORAL_TABLET | Freq: Two times a day (BID) | ORAL | 0 refills | Status: DC

## 2018-11-03 NOTE — Telephone Encounter (Signed)
Last OV with PCP 07/03/18  Last RF 10/06/18

## 2018-11-22 ENCOUNTER — Ambulatory Visit (INDEPENDENT_AMBULATORY_CARE_PROVIDER_SITE_OTHER): Payer: Managed Care, Other (non HMO) | Admitting: Osteopathic Medicine

## 2018-11-22 DIAGNOSIS — Z23 Encounter for immunization: Secondary | ICD-10-CM | POA: Diagnosis not present

## 2018-12-06 ENCOUNTER — Encounter: Payer: Self-pay | Admitting: Osteopathic Medicine

## 2018-12-06 MED ORDER — AMPHETAMINE-DEXTROAMPHETAMINE 30 MG PO TABS: 30.0000 mg | ORAL_TABLET | Freq: Two times a day (BID) | ORAL | 0 refills | Status: DC

## 2018-12-06 NOTE — Telephone Encounter (Signed)
Last appt w/ Dr Georgina Snell 09/13/18  Last appt with Dr Sheppard Coil 07/03/18  Last RX sent in 11/03/18  RX pended

## 2018-12-11 ENCOUNTER — Telehealth: Payer: Self-pay | Admitting: Osteopathic Medicine

## 2018-12-11 MED ORDER — LEVOTHYROXINE SODIUM 137 MCG PO TABS: 274.0000 ug | ORAL_TABLET | Freq: Every day | ORAL | 0 refills | Status: DC

## 2018-12-11 NOTE — Telephone Encounter (Signed)
Refilled Synthroid and sent message to patient to have labs done. KG LPN

## 2019-01-10 ENCOUNTER — Encounter: Payer: Self-pay | Admitting: Osteopathic Medicine

## 2019-01-11 LAB — TSH: TSH: 8.99 mIU/L — ABNORMAL HIGH (ref 0.40–4.50)

## 2019-01-11 MED ORDER — AMPHETAMINE-DEXTROAMPHETAMINE 30 MG PO TABS: 30.0000 mg | ORAL_TABLET | Freq: Two times a day (BID) | ORAL | 0 refills | Status: DC

## 2019-01-11 NOTE — Telephone Encounter (Signed)
RX pended. Note to Dr Jerilynn Mages., are you OK to fill this for patient?

## 2019-01-16 NOTE — Progress Notes (Signed)
Left voicemail with callback instructions TSH is still above goal Please discuss medication administration with patient. Medication should be taken 30 minutes before eating/drinking on an empty stomach by itself. Please ask about missed doses

## 2019-01-30 ENCOUNTER — Encounter: Payer: Self-pay | Admitting: Osteopathic Medicine

## 2019-01-30 ENCOUNTER — Telehealth (INDEPENDENT_AMBULATORY_CARE_PROVIDER_SITE_OTHER): Payer: Managed Care, Other (non HMO) | Admitting: Osteopathic Medicine

## 2019-01-30 VITALS — Wt 181.0 lb

## 2019-01-30 DIAGNOSIS — E039 Hypothyroidism, unspecified: Secondary | ICD-10-CM | POA: Diagnosis not present

## 2019-01-30 DIAGNOSIS — F988 Other specified behavioral and emotional disorders with onset usually occurring in childhood and adolescence: Secondary | ICD-10-CM | POA: Diagnosis not present

## 2019-01-30 MED ORDER — AMPHETAMINE-DEXTROAMPHETAMINE 30 MG PO TABS: 30.0000 mg | ORAL_TABLET | Freq: Two times a day (BID) | ORAL | 0 refills | Status: DC

## 2019-01-30 MED ORDER — LEVOTHYROXINE SODIUM 300 MCG PO TABS: 300.0000 ug | ORAL_TABLET | Freq: Every day | ORAL | 0 refills | Status: DC

## 2019-01-30 NOTE — Patient Instructions (Signed)
Okay to take thyroid medications with food, but this may be affecting your body's ability to absorb the medication and is probably why we are having difficulty getting your levels to goal/why you are requiring higher doses.  Will increase to 300 mcg daily, if this gets this where we need to be, that is okay.  If not, we may have to change how you are taking the medications.  For now, continue taking them as you are!  Plan to repeat blood work in another 6 to 8 weeks, or before your pill bottle is running out.  Refilled Adderall for 6 months, please contact your pharmacy when you are in need of refills.  If you feel you need a medication change or there are any other issues, please contact our office!  Return in about 6 months (around 07/30/2019) for [place] (call week prior to visit for lab orders).

## 2019-01-30 NOTE — Progress Notes (Signed)
Virtual Visit via Video (App used: MyChart) Note  I connected with      Wayne Newman on 01/30/19 at 7:41 AM  by a telemedicine application and verified that I am speaking with the correct person using two identifiers.  Patient is at home I am in office   I discussed the limitations of evaluation and management by telemedicine and the availability of in person appointments. The patient expressed understanding and agreed to proceed.  History of Present Illness: Wayne Newman is a 44 y.o. male who would like to discuss refills on meds, thyroid       Observations/Objective: Wt 181 lb (82.1 kg)   BMI 24.55 kg/m  BP Readings from Last 3 Encounters:  09/13/18 121/74  08/28/18 116/71  08/16/18 121/72   Exam: Normal Speech.  NAD  Lab and Radiology Results No results found for this or any previous visit (from the past 72 hour(s)). No results found.     Assessment and Plan: 44 y.o. male with The primary encounter diagnosis was Hypothyroidism, unspecified type. A diagnosis of Attention deficit disorder, unspecified hyperactivity presence was also pertinent to this visit.   1. Hypothyroidism, unspecified type Taking medications with breakfast and soda before breakfast, probably why he is requiring a higher dose but he is at least being consistent with taking them daily.  Advised fine to take consistently but absorption is certainly going to be limited, if we can get thyroid levels at goal would probably need to change how he is taking the medication.  2. Attention deficit disorder, unspecified hyperactivity presence Refilled medications okay for 6 months   PDMP not reviewed this encounter. Orders Placed This Encounter  Procedures  . TSH   Meds ordered this encounter  Medications  . levothyroxine (SYNTHROID) 300 MCG tablet    Sig: Take 1 tablet (300 mcg total) by mouth daily before breakfast. Generic please and thanks! Labs due when pill bottle running low!    Dispense:   90 tablet    Refill:  0  . amphetamine-dextroamphetamine (ADDERALL) 30 MG tablet    Sig: Take 1 tablet by mouth 2 (two) times daily.    Dispense:  60 tablet    Refill:  0  . amphetamine-dextroamphetamine (ADDERALL) 30 MG tablet    Sig: Take 1 tablet by mouth 2 (two) times daily.    Dispense:  60 tablet    Refill:  0  . amphetamine-dextroamphetamine (ADDERALL) 30 MG tablet    Sig: Take 1 tablet by mouth 2 (two) times daily.    Dispense:  60 tablet    Refill:  0  . amphetamine-dextroamphetamine (ADDERALL) 30 MG tablet    Sig: Take 1 tablet by mouth 2 (two) times daily.    Dispense:  60 tablet    Refill:  0  . amphetamine-dextroamphetamine (ADDERALL) 30 MG tablet    Sig: Take 1 tablet by mouth 2 (two) times daily.    Dispense:  60 tablet    Refill:  0  . amphetamine-dextroamphetamine (ADDERALL) 30 MG tablet    Sig: Take 1 tablet by mouth 2 (two) times daily.    Dispense:  60 tablet    Refill:  0   Patient Instructions  Okay to take thyroid medications with food, but this may be affecting your body's ability to absorb the medication and is probably why we are having difficulty getting your levels to goal/why you are requiring higher doses.  Will increase to 300 mcg daily, if this gets this where  we need to be, that is okay.  If not, we may have to change how you are taking the medications.  For now, continue taking them as you are!  Plan to repeat blood work in another 6 to 8 weeks, or before your pill bottle is running out.  Refilled Adderall for 6 months, please contact your pharmacy when you are in need of refills.  If you feel you need a medication change or there are any other issues, please contact our office!  Return in about 6 months (around 07/30/2019) for [place] (call week prior to visit for lab orders).    Instructions sent via MyChart. If MyChart not available, pt was given option for info via personal e-mail w/ no guarantee of protected health info over unsecured e-mail  communication, and MyChart sign-up instructions were sent to patient.   Follow Up Instructions: Return in about 6 months (around 07/30/2019) for [place] (call week prior to visit for lab orders).    I discussed the assessment and treatment plan with the patient. The patient was provided an opportunity to ask questions and all were answered. The patient agreed with the plan and demonstrated an understanding of the instructions.   The patient was advised to call back or seek an in-person evaluation if any new concerns, if symptoms worsen or if the condition fails to improve as anticipated.  25 minutes of non-face-to-face time was provided during this encounter.      . . . . . . . . . . . . . Marland Kitchen                   Historical information moved to improve visibility of documentation.  Past Medical History:  Diagnosis Date  . Thyroid disease    No past surgical history on file. Social History   Tobacco Use  . Smoking status: Never Smoker  . Smokeless tobacco: Never Used  Substance Use Topics  . Alcohol use: Yes   family history includes Alcohol abuse in his unknown relative; Heart attack in his unknown relative; Stroke in his unknown relative.  Medications: Current Outpatient Medications  Medication Sig Dispense Refill  . [START ON 02/10/2019] amphetamine-dextroamphetamine (ADDERALL) 30 MG tablet Take 1 tablet by mouth 2 (two) times daily. 60 tablet 0  . clindamycin (CLEOCIN) 300 MG capsule Take 1 capsule (300 mg total) by mouth 3 (three) times daily. 42 capsule 0  . doxycycline (VIBRA-TABS) 100 MG tablet Take 1 tablet (100 mg total) by mouth 2 (two) times daily. 28 tablet 0  . levothyroxine (SYNTHROID) 300 MCG tablet Take 1 tablet (300 mcg total) by mouth daily before breakfast. Generic please and thanks! Labs due when pill bottle running low! 90 tablet 0  . [START ON 03/12/2019] amphetamine-dextroamphetamine (ADDERALL) 30 MG tablet Take 1 tablet by mouth 2  (two) times daily. 60 tablet 0  . [START ON 04/11/2019] amphetamine-dextroamphetamine (ADDERALL) 30 MG tablet Take 1 tablet by mouth 2 (two) times daily. 60 tablet 0  . [START ON 05/11/2019] amphetamine-dextroamphetamine (ADDERALL) 30 MG tablet Take 1 tablet by mouth 2 (two) times daily. 60 tablet 0  . [START ON 06/10/2019] amphetamine-dextroamphetamine (ADDERALL) 30 MG tablet Take 1 tablet by mouth 2 (two) times daily. 60 tablet 0  . [START ON 07/10/2019] amphetamine-dextroamphetamine (ADDERALL) 30 MG tablet Take 1 tablet by mouth 2 (two) times daily. 60 tablet 0   No current facility-administered medications for this visit.   Allergies  Allergen Reactions  . Sulfur Rash and Other (  See Comments)    Had a sulfur abx for pink eye and made eyes more irritated and red   . Sulfa Antibiotics Other (See Comments)    Given for pink eye & irritated eye more.

## 2019-03-13 ENCOUNTER — Encounter: Payer: Self-pay | Admitting: Osteopathic Medicine

## 2019-05-09 ENCOUNTER — Other Ambulatory Visit: Payer: Self-pay

## 2019-05-09 MED ORDER — LEVOTHYROXINE SODIUM 300 MCG PO TABS: 300.0000 ug | ORAL_TABLET | Freq: Every day | ORAL | 0 refills | Status: DC

## 2019-06-07 ENCOUNTER — Encounter: Payer: Self-pay | Admitting: Osteopathic Medicine

## 2019-06-15 ENCOUNTER — Other Ambulatory Visit: Payer: Self-pay

## 2019-06-15 MED ORDER — LEVOTHYROXINE SODIUM 300 MCG PO TABS: 300.0000 ug | ORAL_TABLET | Freq: Every day | ORAL | 0 refills | Status: DC

## 2019-06-30 LAB — TSH: TSH: 0.45 mIU/L (ref 0.40–4.50)

## 2019-07-02 ENCOUNTER — Other Ambulatory Visit: Payer: Self-pay | Admitting: Osteopathic Medicine

## 2019-07-02 MED ORDER — LEVOTHYROXINE SODIUM 300 MCG PO TABS: 300.0000 ug | ORAL_TABLET | Freq: Every day | ORAL | 3 refills | Status: DC

## 2019-07-11 ENCOUNTER — Encounter: Payer: Self-pay | Admitting: Osteopathic Medicine

## 2019-07-11 NOTE — Telephone Encounter (Signed)
Opened in error

## 2019-08-08 ENCOUNTER — Encounter: Payer: Self-pay | Admitting: Osteopathic Medicine

## 2019-08-08 MED ORDER — AMPHETAMINE-DEXTROAMPHETAMINE 30 MG PO TABS: 30.0000 mg | ORAL_TABLET | Freq: Two times a day (BID) | ORAL | 0 refills | Status: DC

## 2019-08-08 NOTE — Telephone Encounter (Signed)
Last written - 07/10/19. Rx pended.

## 2019-09-10 ENCOUNTER — Encounter: Payer: Self-pay | Admitting: Osteopathic Medicine

## 2019-09-13 ENCOUNTER — Telehealth: Payer: Self-pay | Admitting: Osteopathic Medicine

## 2019-09-13 NOTE — Telephone Encounter (Signed)
Patient wanted to know if his meds for amphetamine-dextroamphetamine (ADDERALL) 30 MG tablet [[id number]] ENDED can be a virtual appointment or does it need to be in office.

## 2019-09-13 NOTE — Telephone Encounter (Signed)
Appointment has been made. No further questions at this time.  

## 2019-09-14 ENCOUNTER — Ambulatory Visit (INDEPENDENT_AMBULATORY_CARE_PROVIDER_SITE_OTHER): Payer: Managed Care, Other (non HMO) | Admitting: Nurse Practitioner

## 2019-09-14 ENCOUNTER — Encounter: Payer: Self-pay | Admitting: Nurse Practitioner

## 2019-09-14 VITALS — BP 123/75 | HR 72 | Ht 72.0 in | Wt 181.0 lb

## 2019-09-14 DIAGNOSIS — E038 Other specified hypothyroidism: Secondary | ICD-10-CM | POA: Diagnosis not present

## 2019-09-14 DIAGNOSIS — F988 Other specified behavioral and emotional disorders with onset usually occurring in childhood and adolescence: Secondary | ICD-10-CM | POA: Diagnosis not present

## 2019-09-14 DIAGNOSIS — Z23 Encounter for immunization: Secondary | ICD-10-CM | POA: Insufficient documentation

## 2019-09-14 MED ORDER — LEVOTHYROXINE SODIUM 300 MCG PO TABS: 300.0000 ug | ORAL_TABLET | Freq: Every day | ORAL | 3 refills | Status: DC

## 2019-09-14 MED ORDER — AMPHETAMINE-DEXTROAMPHETAMINE 30 MG PO TABS: 30.0000 mg | ORAL_TABLET | Freq: Two times a day (BID) | ORAL | 0 refills | Status: DC

## 2019-09-14 NOTE — Assessment & Plan Note (Signed)
Flu vaccine provided today.  

## 2019-09-14 NOTE — Assessment & Plan Note (Signed)
ADHD well controlled with current medication regimen of Adderall 30 mg twice a day dosing.  Patient is taking this medication on workdays and allowing medication free holidays.  No side effects or concerns noted today. PDMP monitor today. Refills provided for 3 months dated for each month.   Follow-up in approximately 3 months for ADHD check and additional refills.

## 2019-09-14 NOTE — Progress Notes (Signed)
Acute Office Visit  Subjective:    Patient ID: Wayne BoastRyan Newman, male    DOB: May 02, 1975, 44 y.o.   MRN: [id number]  Chief Complaint  Patient presents with  . ADHD    HPI Patient is in today for follow-up for ADHD and refills on medication.  He also needs refills on his Synthroid today.  ADHD FOLLOW UP ADHD status: controlled Satisfied with current therapy: yes Medication compliance:  excellent compliance  Taking meds on weekends/vacations: no Work/school performance:  good Difficulty sustaining attention/completing tasks: no Distracted by extraneous stimuli: no Does not listen when spoken to: no  Fidgets with hands or feet: no Unable to stay in seat: no Blurts out/interrupts others: no ADHD Medication Side Effects: no    Decreased appetite: no    Headache: no    Sleeping disturbance pattern: no    Irritability: no    Rebound effects (worse than baseline) off medication: no    Anxiousness: no    Dizziness: no    Tics: no   THYROID Patient reports he is taking medication as prescribed.  He denies any symptoms of weight gain or weight loss, palpitations, fatigue, hair loss, anxiety, heat or cold intolerance, GI symptoms.   Past Medical History:  Diagnosis Date  . Thyroid disease     History reviewed. No pertinent surgical history.  Family History  Problem Relation Age of Onset  . Alcohol abuse Unknown        grandfather  . Heart attack Unknown        grandfather  . Stroke Unknown        grandfather    Social History   Socioeconomic History  . Marital status: Married    Spouse name: Not on file  . Number of children: Not on file  . Years of education: Not on file  . Highest education level: Not on file  Occupational History  . Not on file  Tobacco Use  . Smoking status: Never Smoker  . Smokeless tobacco: Never Used  Substance and Sexual Activity  . Alcohol use: Yes  . Drug use: No  . Sexual activity: Yes    Birth control/protection: Condom  Other  Topics Concern  . Not on file  Social History Narrative  . Not on file   Social Determinants of Health   Financial Resource Strain:   . Difficulty of Paying Living Expenses: Not on file  Food Insecurity:   . Worried About Programme researcher, broadcasting/film/videounning Out of Food in the Last Year: Not on file  . Ran Out of Food in the Last Year: Not on file  Transportation Needs:   . Lack of Transportation (Medical): Not on file  . Lack of Transportation (Non-Medical): Not on file  Physical Activity:   . Days of Exercise per Week: Not on file  . Minutes of Exercise per Session: Not on file  Stress:   . Feeling of Stress : Not on file  Social Connections:   . Frequency of Communication with Friends and Family: Not on file  . Frequency of Social Gatherings with Friends and Family: Not on file  . Attends Religious Services: Not on file  . Active Member of Clubs or Organizations: Not on file  . Attends BankerClub or Organization Meetings: Not on file  . Marital Status: Not on file  Intimate Partner Violence:   . Fear of Current or Ex-Partner: Not on file  . Emotionally Abused: Not on file  . Physically Abused: Not on file  . Sexually Abused:  Not on file    Outpatient Medications Prior to Visit  Medication Sig Dispense Refill  . amphetamine-dextroamphetamine (ADDERALL) 30 MG tablet Take 1 tablet by mouth 2 (two) times daily. 60 tablet 0  . amphetamine-dextroamphetamine (ADDERALL) 30 MG tablet Take 1 tablet by mouth 2 (two) times daily. 60 tablet 0  . amphetamine-dextroamphetamine (ADDERALL) 30 MG tablet Take 1 tablet by mouth 2 (two) times daily. 60 tablet 0  . levothyroxine (SYNTHROID) 300 MCG tablet Take 1 tablet (300 mcg total) by mouth daily before breakfast. Generic please and thanks! 90 tablet 3  . amphetamine-dextroamphetamine (ADDERALL) 30 MG tablet Take 1 tablet by mouth 2 (two) times daily. 60 tablet 0  . amphetamine-dextroamphetamine (ADDERALL) 30 MG tablet Take 1 tablet by mouth 2 (two) times daily. 60 tablet 0   . amphetamine-dextroamphetamine (ADDERALL) 30 MG tablet Take 1 tablet by mouth 2 (two) times daily. 60 tablet 0   No facility-administered medications prior to visit.    Allergies  Allergen Reactions  . Sulfur Rash and Other (See Comments)    Had a sulfur abx for pink eye and made eyes more irritated and red   . Sulfa Antibiotics Other (See Comments)    Given for pink eye & irritated eye more.        Objective:    Physical Exam Vitals and nursing note reviewed.  Constitutional:      Appearance: Normal appearance. He is normal weight.  HENT:     Head: Normocephalic.  Eyes:     Extraocular Movements: Extraocular movements intact.     Conjunctiva/sclera: Conjunctivae normal.     Pupils: Pupils are equal, round, and reactive to light.  Neck:     Thyroid: No thyroid mass, thyromegaly or thyroid tenderness.  Cardiovascular:     Rate and Rhythm: Normal rate and regular rhythm.     Pulses: Normal pulses.     Heart sounds: Normal heart sounds.  Pulmonary:     Effort: Pulmonary effort is normal.     Breath sounds: Normal breath sounds.  Abdominal:     General: Abdomen is flat. Bowel sounds are normal.     Palpations: Abdomen is soft.  Musculoskeletal:        General: Normal range of motion.     Cervical back: Normal range of motion.  Lymphadenopathy:     Cervical: No cervical adenopathy.  Skin:    General: Skin is warm and dry.     Capillary Refill: Capillary refill takes less than 2 seconds.  Neurological:     General: No focal deficit present.     Mental Status: He is alert and oriented to person, place, and time.  Psychiatric:        Mood and Affect: Mood normal.        Behavior: Behavior normal.        Thought Content: Thought content normal.        Judgment: Judgment normal.     BP 123/75   Pulse 72   Ht 6' (1.829 m)   Wt 181 lb (82.1 kg)   SpO2 99%   BMI 24.55 kg/m  Wt Readings from Last 3 Encounters:  09/14/19 181 lb (82.1 kg)  01/30/19 181 lb (82.1  kg)  09/13/18 188 lb (85.3 kg)    Health Maintenance Due  Topic Date Due  . Hepatitis C Screening  Never done  . HIV Screening  Never done    There are no preventive care reminders to display for this patient.  Lab Results  Component Value Date   TSH 0.45 06/29/2019   Lab Results  Component Value Date   WBC 6.7 08/28/2018   HGB 15.2 08/28/2018   HCT 44.4 08/28/2018   MCV 93.9 08/28/2018   PLT 209 08/28/2018   Lab Results  Component Value Date   NA 143 08/28/2018   K 4.3 08/28/2018   CO2 29 08/28/2018   GLUCOSE 69 08/28/2018   BUN 18 08/28/2018   CREATININE 0.94 08/28/2018   BILITOT 1.0 08/28/2018   ALKPHOS 52 03/01/2016   AST 36 08/28/2018   ALT 49 (H) 08/28/2018   PROT 6.5 08/28/2018   ALBUMIN 4.3 03/01/2016   CALCIUM 9.9 08/28/2018   Lab Results  Component Value Date   CHOL 164 08/28/2018   Lab Results  Component Value Date   HDL 45 08/28/2018   Lab Results  Component Value Date   LDLCALC 97 08/28/2018   Lab Results  Component Value Date   TRIG 128 08/28/2018   Lab Results  Component Value Date   CHOLHDL 3.6 08/28/2018   No results found for: HGBA1C     Assessment & Plan:   Problem List Items Addressed This Visit      Endocrine   Hypothyroid (Chronic)    Hypothyroidism with daily Synthroid 300 mg.  No symptoms or concerns noted today. Recent labs 2 months ago were within normal limits. Refills provided. Follow-up with PCP for repeat labs in June of next year or sooner if needed.      Relevant Medications   levothyroxine (SYNTHROID) 300 MCG tablet     Other   ADD (attention deficit disorder) - Primary (Chronic)    ADHD well controlled with current medication regimen of Adderall 30 mg twice a day dosing.  Patient is taking this medication on workdays and allowing medication free holidays.  No side effects or concerns noted today. PDMP monitor today. Refills provided for 3 months dated for each month.   Follow-up in approximately 3  months for ADHD check and additional refills.      Relevant Medications   levothyroxine (SYNTHROID) 300 MCG tablet   amphetamine-dextroamphetamine (ADDERALL) 30 MG tablet   amphetamine-dextroamphetamine (ADDERALL) 30 MG tablet (Start on 10/14/2019)   amphetamine-dextroamphetamine (ADDERALL) 30 MG tablet (Start on 11/13/2019)   Flu vaccine need    Flu vaccine provided today.      Relevant Orders   Flu Vaccine QUAD 36+ mos IM (Completed)       Meds ordered this encounter  Medications  . levothyroxine (SYNTHROID) 300 MCG tablet    Sig: Take 1 tablet (300 mcg total) by mouth daily before breakfast. Generic please and thanks!    Dispense:  90 tablet    Refill:  3  . amphetamine-dextroamphetamine (ADDERALL) 30 MG tablet    Sig: Take 1 tablet by mouth 2 (two) times daily. RX: 1/3    Dispense:  60 tablet    Refill:  0  . amphetamine-dextroamphetamine (ADDERALL) 30 MG tablet    Sig: Take 1 tablet by mouth 2 (two) times daily. Rx: 2/3    Dispense:  60 tablet    Refill:  0  . amphetamine-dextroamphetamine (ADDERALL) 30 MG tablet    Sig: Take 1 tablet by mouth 2 (two) times daily. Rx: 3/3- Please schedule follow-up appointment for future refills.    Dispense:  60 tablet    Refill:  0   Follow-up in 3 months for ADHD evaluation and refills.  Follow-up in June 2022 for thyroid  labs or sooner if needed.  Tollie Eth, NP

## 2019-09-14 NOTE — Patient Instructions (Signed)
I have sent in 3 months of refills for you today.  You will be able to pick up the first refill today. Your 2nd refill will be ready on 10/3 Your last refill will be ready on 11/2  When you pick up your last refill, please be sure to schedule a follow-up appointment for evaluation, if you have not already done so. This can be in person or virtual visit, whichever is best for you.   We need to check your thyroid levels today to make sure that you are still on the correct dose of synthroid. Please go to the lab and have these drawn before you leave the office today.  I will send in refills for your Synthroid to the pharmacy.  If we need to make changes to your dosing based on your labs we will let you know.

## 2019-09-14 NOTE — Assessment & Plan Note (Signed)
Hypothyroidism with daily Synthroid 300 mg.  No symptoms or concerns noted today. Recent labs 2 months ago were within normal limits. Refills provided. Follow-up with PCP for repeat labs in June of next year or sooner if needed.

## 2019-09-19 ENCOUNTER — Encounter: Payer: Self-pay | Admitting: Osteopathic Medicine

## 2019-09-19 DIAGNOSIS — F988 Other specified behavioral and emotional disorders with onset usually occurring in childhood and adolescence: Secondary | ICD-10-CM

## 2019-09-21 MED ORDER — AMPHETAMINE-DEXTROAMPHETAMINE 30 MG PO TABS: 30.0000 mg | ORAL_TABLET | Freq: Two times a day (BID) | ORAL | 0 refills | Status: DC

## 2019-12-19 ENCOUNTER — Other Ambulatory Visit: Payer: Self-pay | Admitting: Osteopathic Medicine

## 2019-12-19 DIAGNOSIS — F988 Other specified behavioral and emotional disorders with onset usually occurring in childhood and adolescence: Secondary | ICD-10-CM

## 2019-12-20 MED ORDER — AMPHETAMINE-DEXTROAMPHETAMINE 30 MG PO TABS: 30.0000 mg | ORAL_TABLET | Freq: Two times a day (BID) | ORAL | 0 refills | Status: DC

## 2020-01-21 ENCOUNTER — Encounter: Payer: Self-pay | Admitting: Osteopathic Medicine

## 2020-01-21 DIAGNOSIS — F988 Other specified behavioral and emotional disorders with onset usually occurring in childhood and adolescence: Secondary | ICD-10-CM

## 2020-01-22 NOTE — Telephone Encounter (Signed)
Rx pended

## 2020-01-23 MED ORDER — AMPHETAMINE-DEXTROAMPHETAMINE 30 MG PO TABS: 30.0000 mg | ORAL_TABLET | Freq: Two times a day (BID) | ORAL | 0 refills | Status: DC

## 2020-01-31 ENCOUNTER — Encounter: Payer: Self-pay | Admitting: Osteopathic Medicine

## 2020-02-01 ENCOUNTER — Telehealth (INDEPENDENT_AMBULATORY_CARE_PROVIDER_SITE_OTHER): Payer: BC Managed Care – PPO | Admitting: Osteopathic Medicine

## 2020-02-01 ENCOUNTER — Encounter: Payer: Self-pay | Admitting: Osteopathic Medicine

## 2020-02-01 ENCOUNTER — Other Ambulatory Visit: Payer: Self-pay

## 2020-02-01 ENCOUNTER — Ambulatory Visit (INDEPENDENT_AMBULATORY_CARE_PROVIDER_SITE_OTHER): Payer: BC Managed Care – PPO

## 2020-02-01 VITALS — Wt 180.0 lb

## 2020-02-01 DIAGNOSIS — R0789 Other chest pain: Secondary | ICD-10-CM | POA: Diagnosis not present

## 2020-02-01 DIAGNOSIS — R0602 Shortness of breath: Secondary | ICD-10-CM

## 2020-02-01 DIAGNOSIS — R0781 Pleurodynia: Secondary | ICD-10-CM

## 2020-02-01 DIAGNOSIS — R079 Chest pain, unspecified: Secondary | ICD-10-CM | POA: Diagnosis not present

## 2020-02-01 NOTE — Progress Notes (Signed)
Telemedicine Visit via  Audio only/telephone due to technical difficulties with A/V format   I connected with Wayne Newman on 02/01/20 at 8:12 AM  by phone or  telemedicine application as noted above  I verified that I am speaking with or regarding  the correct patient using two identifiers.  Participants: Myself, Dr Sunnie Nielsen DO Patient: Wayne Newman Patient proxy if applicable: none Other, if applicable: none  Patient is at home I am in office at [facility]    I discussed the limitations of evaluation and management  by telemedicine and the availability of in person appointments.  The participant(s) above expressed understanding and  agreed to proceed with this appointment via telemedicine.       History of Present Illness: [name] is a 45 y.o. male who would like to discuss concern for rib fracture.  1 week ago he was snowboarding and fell onto his right side/chest on some icy snow.  Since that point, has been having fairly significant right sided chest/torso pain under his armpit, into the pectoral area.  He has been using Aleve nightly for pain relief, he is still feeling quite sore and is now concerned about possible rib fracture or damage to underlying structures.  Reports pain with deep inspiration but no significant shortness of breath at rest, no coughing blood, occasional mild dry cough, no fever, no lightheadedness.        Observations/Objective: Wt 180 lb (81.6 kg)    BMI 24.41 kg/m  BP Readings from Last 3 Encounters:  09/14/19 123/75  09/13/18 121/74  08/28/18 116/71   Exam: Normal Speech.    Lab and Radiology Results No results found for this or any previous visit (from the past 72 hour(s)). DG Chest 2 View  Result Date: 02/01/2020 CLINICAL DATA:  Fall 1 week ago.  Right chest pain EXAM: CHEST - 2 VIEW COMPARISON:  None. FINDINGS: The heart size and mediastinal contours are within normal limits. Both lungs are clear. The  visualized skeletal structures are unremarkable. IMPRESSION: No active cardiopulmonary disease. Electronically Signed   By: Wayne Newman M.D.   On: 02/01/2020 09:12   DG Ribs Unilateral Right  Result Date: 02/01/2020 CLINICAL DATA:  Fall 1 week ago.  Right chest pain EXAM: RIGHT RIBS - 2 VIEW COMPARISON:  None. FINDINGS: No fracture or other bone lesions are seen involving the ribs. IMPRESSION: Negative. Electronically Signed   By: Wayne Newman M.D.   On: 02/01/2020 09:13       Assessment and Plan: 45 y.o. male with The primary encounter diagnosis was Right-sided chest wall pain. Diagnoses of Fall from snowboard, initial encounter and Shortness of breath were also pertinent to this visit.  Thankfully, does not appear to be any fracture or other cardiopulmonary abnormality such as pneumothorax, hemothorax.  I think most likely patient is under treating for pain, advised increase use of NSAIDs with Aleve 2 times a day to 3 times a day, would still work on incentive spirometry techniques.  Patient is reluctant to take opiate pain medications, he is advised that if he is still having significant pain through the weekend after rest/NSAID, I am happy to send in 5-10 oxycodone or similar for him for pain relief.  Patient is advised that if symptoms worsen, please seek emergency medical attention.  If symptoms or not resolving/worsen would obtain CT chest.  I suspect possible bruising musculoskeletal area and possibly of lungs, may be a component of costochondritis as well, but based on no  red flag symptoms and patient has been otherwise stable for a week since the injury I have very low suspicion for serious underlying issue  PDMP not reviewed this encounter. Orders Placed This Encounter  Procedures   DG Chest 2 View    Order Specific Question:   Reason for Exam (SYMPTOM  OR DIAGNOSIS REQUIRED)    Answer:   fall snowboarding 1 week ago, side/chest pain, mild cough/SOB suspect rib fracture vs PNA vs  both    Order Specific Question:   Preferred imaging location?    Answer:   [facility]    Order Specific Question:   Call Results- Best Contact Number?    Answer:   Dr Wayne LeeksAlexander mobil [phone]   DG Ribs Unilateral Right    Order Specific Question:   Reason for Exam (SYMPTOM  OR DIAGNOSIS REQUIRED)    Answer:   fall snowboarding 1 week ago, side/chest pain, mild cough/SOB suspect rib fracture vs PNA vs both    Order Specific Question:   Preferred imaging location?    Answer:   [facility]    Order Specific Question:   Call Results- Best Contact Number?    Answer:   Dr Wayne HollingsheadAlexander mobile [phone]   No orders of the defined types were placed in this encounter.  Patient Instructions  Patient education: Rib fractures in adults (The Basics) Written by the doctors and editors at UpToDate  What is a rib fracture?--A "fracture" is another word for a broken bone. A rib fracture is when a person breaks a rib bone. Most rib fractures happen after an injury to the chest. If this injury is severe, it can also damage organs in the chest or belly. Some rib fractures, called "stress fractures," do not happen after an injury. Instead, they are caused by a severe cough or a motion people do over and over, like swinging a golf club.  What are the symptoms of a rib fracture?--The most common symptom of a rib fracture is pain. The pain is usually worse when you press on the injured area or take a deep breath. Some people also have bruising over the injured area. A stress fracture also causes pain, but the pain usually starts slowly and gets worse over time.  Is there a test for a rib fracture?--Yes. Your doctor or nurse will ask about your symptoms and do an exam. He or she might order a chest X-ray. Some people need other imaging tests, such as a CT scan, bone scan, or ultrasound. Imaging tests create pictures of the inside of the body.  How are rib fractures  treated?--Treatment depends on how many ribs you broke and your other injuries. People with 3 or more broken ribs or other serious injuries usually need treatment in the hospital. People who do not need treatment in the hospital are treated with pain-relieving medicines. Treating the pain is important, because when people have rib pain, they try to keep their ribs from moving too much. Then they don't breathe in and out as deeply as they should. When people don't breathe in and out as deeply as they should, some of the small airways in the lungs can close off. This raises the chances of getting a lung infection, aka pneumonia. To treat your pain, your doctor can use: ?Strong pain medicines ?Nonsteroidal antiinflammatory drugs, also called "NSAIDs" - NSAIDS are a large group of medicines that includes ibuprofen (sample brand names: Advil, Motrin) and naproxen (sample brand name: Aleve). Your doctor  will probably also recommend a treatment to help prevent your small airways from closing off. This treatment, called "incentive spirometry," involves breathing deeply into a hand-held device a number of times each day. Treatment for a stress fracture involves avoiding the activity that caused your stress fracture for 4 to 6 weeks. Then you can slowly restart that activity. How long do rib fractures take to heal?--Rib fractures can take weeks to months to heal, depending on how mild or serious the fracture is. Healing time also depends on the person. Healthy children usually heal very quickly. Older adults or adults with other medical problems can take much longer to heal.  Can I do anything to improve the healing process?--Yes. It's important to follow all of your doctor's instructions while your fracture is healing. He or she will probably recommend that you eat a healthy diet that includes getting enough calcium, vitamin D, and protein. He or she will also probably recommend that you: ?Avoid doing  certain activities. ?Avoid smoking. A fracture can take longer to heal if you smoke. When should I call my doctor or nurse?--After treatment, your doctor or nurse will tell you when to call him or her. In general, you should call him or her if: ?You get a fever. ?You have trouble breathing. ?You have a cough that is getting worse. ?You have severe pain or are very weak. ?Your skin looks more pale than usual.     Instructions sent via MyChart.   Follow Up Instructions: Return for RECHECK PENDING RESULTS / IF WORSE OR CHANGE.    I discussed the assessment and treatment plan with the patient. The patient was provided an opportunity to ask questions and all were answered. The patient agreed with the plan and demonstrated an understanding of the instructions.   The patient was advised to call back or seek an in-person evaluation if any new concerns, if symptoms worsen or if the condition fails to improve as anticipated.  30 minutes of non-face-to-face time was provided during this encounter.      . . . . . . . . . . . . . Marland Kitchen                   Historical information moved to improve visibility of documentation.  Past Medical History:  Diagnosis Date   Thyroid disease    No past surgical history on file. Social History   Tobacco Use   Smoking status: Never Smoker   Smokeless tobacco: Never Used  Substance Use Topics   Alcohol use: Yes   family history includes Alcohol abuse in his unknown relative; Heart attack in his unknown relative; Stroke in his unknown relative.  Medications: Current Outpatient Medications  Medication Sig Dispense Refill   amphetamine-dextroamphetamine (ADDERALL) 30 MG tablet Take 1 tablet by mouth 2 (two) times daily. Rx: 3/3- Please schedule follow-up appointment for future refills. 60 tablet 0   levothyroxine (SYNTHROID) 300 MCG tablet Take 1 tablet (300 mcg total) by mouth daily before breakfast. Generic please  and thanks! 90 tablet 3   amphetamine-dextroamphetamine (ADDERALL) 30 MG tablet Take 1 tablet by mouth 2 (two) times daily. RX: 1/3 60 tablet 0   amphetamine-dextroamphetamine (ADDERALL) 30 MG tablet Take 1 tablet by mouth 2 (two) times daily. Rx: 2/3 60 tablet 0   No current facility-administered medications for this visit.   Allergies  Allergen Reactions   Elemental Sulfur Rash and Other (See Comments)    Had a sulfur abx for pink  eye and made eyes more irritated and red    Sulfa Antibiotics Other (See Comments)    Given for pink eye & irritated eye more.     If phone visit, billing and coding can please add appropriate modifier if needed

## 2020-02-01 NOTE — Patient Instructions (Addendum)
See below for extra information on rib fracture, there is no apparent fracture on the x-ray but we should see how you do with more aggressive medication management for pain control over the weekend, if symptoms are not improving/if they get worse, the neck step would be a CT scan but I have very low suspicion for significant damage to underlying structures such as lungs or heart given that the x-ray did not show any evidence of this.  An x-ray would not show bruising but it would show evidence of punctured lung, blood into the chest cavity, etc.  Take it easy and rest.  See below for instructions on breathing exercises since we didn't have an incentive spirometer for you to use.  Okay to take Aleve/naproxen 325 mg 3 times per day if needed.  If pain is still not well controlled I am okay to send a limited prescription for opiate pain medications.    Breathing Exercises  Take a deep breath and hold it for a few seconds before slowly breathing out.  Take a deep breath, bringing in as much air as you can as fast as you can before pushing the air out as fast as you can.  Take a deep breath and hold it, take another breath and hold it, and take one more before slowly breathing out.  Take a deep breath in then breathe out, counting as long and as fast as you can.   Patient education: Rib fractures in adults (The Basics) Written by the doctors and editors at UpToDate  What is a rib fracture?--A "fracture" is another word for a broken bone. A rib fracture is when a person breaks a rib bone. Most rib fractures happen after an injury to the chest. If this injury is severe, it can also damage organs in the chest or belly. Some rib fractures, called "stress fractures," do not happen after an injury. Instead, they are caused by a severe cough or a motion people do over and over, like swinging a golf club.  What are the symptoms of a rib fracture?--The most common symptom of a rib fracture is pain. The  pain is usually worse when you press on the injured area or take a deep breath. Some people also have bruising over the injured area. A stress fracture also causes pain, but the pain usually starts slowly and gets worse over time.  Is there a test for a rib fracture?--Yes. Your doctor or nurse will ask about your symptoms and do an exam. He or she might order a chest X-ray. Some people need other imaging tests, such as a CT scan, bone scan, or ultrasound. Imaging tests create pictures of the inside of the body.  How are rib fractures treated?--Treatment depends on how many ribs you broke and your other injuries. People with 3 or more broken ribs or other serious injuries usually need treatment in the hospital. People who do not need treatment in the hospital are treated with pain-relieving medicines. Treating the pain is important, because when people have rib pain, they try to keep their ribs from moving too much. Then they don't breathe in and out as deeply as they should. When people don't breathe in and out as deeply as they should, some of the small airways in the lungs can close off. This raises the chances of getting a lung infection, aka pneumonia. To treat your pain, your doctor can use: ?Strong pain medicines ?Nonsteroidal antiinflammatory drugs, also called "NSAIDs" - NSAIDS are a  large group of medicines that includes ibuprofen (sample brand names: Advil, Motrin) and naproxen (sample brand name: Aleve). Your doctor will probably also recommend a treatment to help prevent your small airways from closing off. This treatment, called "incentive spirometry," involves breathing deeply into a hand-held device a number of times each day. Treatment for a stress fracture involves avoiding the activity that caused your stress fracture for 4 to 6 weeks. Then you can slowly restart that activity. How long do rib fractures take to heal?--Rib fractures can take weeks to months to heal, depending on  how mild or serious the fracture is. Healing time also depends on the person. Healthy children usually heal very quickly. Older adults or adults with other medical problems can take much longer to heal.  Can I do anything to improve the healing process?--Yes. It's important to follow all of your doctor's instructions while your fracture is healing. He or she will probably recommend that you eat a healthy diet that includes getting enough calcium, vitamin D, and protein. He or she will also probably recommend that you: ?Avoid doing certain activities. ?Avoid smoking. A fracture can take longer to heal if you smoke. When should I call my doctor or nurse?--After treatment, your doctor or nurse will tell you when to call him or her. In general, you should call him or her if: ?You get a fever. ?You have trouble breathing. ?You have a cough that is getting worse. ?You have severe pain or are very weak. ?Your skin looks more pale than usual.

## 2020-02-06 DIAGNOSIS — F4323 Adjustment disorder with mixed anxiety and depressed mood: Secondary | ICD-10-CM | POA: Diagnosis not present

## 2020-02-13 DIAGNOSIS — F4323 Adjustment disorder with mixed anxiety and depressed mood: Secondary | ICD-10-CM | POA: Diagnosis not present

## 2020-02-20 DIAGNOSIS — F4323 Adjustment disorder with mixed anxiety and depressed mood: Secondary | ICD-10-CM | POA: Diagnosis not present

## 2020-02-21 ENCOUNTER — Encounter: Payer: Self-pay | Admitting: Osteopathic Medicine

## 2020-02-21 DIAGNOSIS — F988 Other specified behavioral and emotional disorders with onset usually occurring in childhood and adolescence: Secondary | ICD-10-CM

## 2020-02-22 MED ORDER — AMPHETAMINE-DEXTROAMPHETAMINE 30 MG PO TABS: 30.0000 mg | ORAL_TABLET | Freq: Two times a day (BID) | ORAL | 0 refills | Status: DC

## 2020-02-27 DIAGNOSIS — F4323 Adjustment disorder with mixed anxiety and depressed mood: Secondary | ICD-10-CM | POA: Diagnosis not present

## 2020-03-05 DIAGNOSIS — F4323 Adjustment disorder with mixed anxiety and depressed mood: Secondary | ICD-10-CM | POA: Diagnosis not present

## 2020-03-12 DIAGNOSIS — F4323 Adjustment disorder with mixed anxiety and depressed mood: Secondary | ICD-10-CM | POA: Diagnosis not present

## 2020-03-19 DIAGNOSIS — F4323 Adjustment disorder with mixed anxiety and depressed mood: Secondary | ICD-10-CM | POA: Diagnosis not present

## 2020-03-26 DIAGNOSIS — F4323 Adjustment disorder with mixed anxiety and depressed mood: Secondary | ICD-10-CM | POA: Diagnosis not present

## 2020-04-04 DIAGNOSIS — F4323 Adjustment disorder with mixed anxiety and depressed mood: Secondary | ICD-10-CM | POA: Diagnosis not present

## 2020-04-11 DIAGNOSIS — F4323 Adjustment disorder with mixed anxiety and depressed mood: Secondary | ICD-10-CM | POA: Diagnosis not present

## 2020-04-16 DIAGNOSIS — F4323 Adjustment disorder with mixed anxiety and depressed mood: Secondary | ICD-10-CM | POA: Diagnosis not present

## 2020-04-23 DIAGNOSIS — F4323 Adjustment disorder with mixed anxiety and depressed mood: Secondary | ICD-10-CM | POA: Diagnosis not present

## 2020-04-30 DIAGNOSIS — F4323 Adjustment disorder with mixed anxiety and depressed mood: Secondary | ICD-10-CM | POA: Diagnosis not present

## 2020-05-07 DIAGNOSIS — F4323 Adjustment disorder with mixed anxiety and depressed mood: Secondary | ICD-10-CM | POA: Diagnosis not present

## 2020-05-08 DIAGNOSIS — F4323 Adjustment disorder with mixed anxiety and depressed mood: Secondary | ICD-10-CM | POA: Diagnosis not present

## 2020-05-14 DIAGNOSIS — F4323 Adjustment disorder with mixed anxiety and depressed mood: Secondary | ICD-10-CM | POA: Diagnosis not present

## 2020-05-21 DIAGNOSIS — F4323 Adjustment disorder with mixed anxiety and depressed mood: Secondary | ICD-10-CM | POA: Diagnosis not present

## 2020-05-26 ENCOUNTER — Telehealth: Payer: Self-pay | Admitting: Osteopathic Medicine

## 2020-05-26 ENCOUNTER — Encounter: Payer: Self-pay | Admitting: Osteopathic Medicine

## 2020-05-26 DIAGNOSIS — F988 Other specified behavioral and emotional disorders with onset usually occurring in childhood and adolescence: Secondary | ICD-10-CM

## 2020-05-26 NOTE — Telephone Encounter (Signed)
Pt called. Pt needs a refill on his ADHD meds; he has two days left on meds. He has scheduled an appointment for May 23rd,  Thank you

## 2020-05-27 MED ORDER — AMPHETAMINE-DEXTROAMPHETAMINE 30 MG PO TABS: 30.0000 mg | ORAL_TABLET | Freq: Two times a day (BID) | ORAL | 0 refills | Status: DC

## 2020-05-27 NOTE — Telephone Encounter (Signed)
Rx sent He's due for follow up on ADHD Rx, please have him schedule something in next 30 days

## 2020-05-28 DIAGNOSIS — F4323 Adjustment disorder with mixed anxiety and depressed mood: Secondary | ICD-10-CM | POA: Diagnosis not present

## 2020-05-28 NOTE — Telephone Encounter (Signed)
Please call the patient for scheduling with provider - f/u on ADHD. Thanks in advance!

## 2020-05-29 NOTE — Telephone Encounter (Signed)
Patient is scheduled for Monday 06/02/20. AM

## 2020-05-29 NOTE — Telephone Encounter (Signed)
Great!

## 2020-06-02 ENCOUNTER — Other Ambulatory Visit: Payer: Self-pay

## 2020-06-02 ENCOUNTER — Encounter: Payer: Self-pay | Admitting: Osteopathic Medicine

## 2020-06-02 ENCOUNTER — Ambulatory Visit (INDEPENDENT_AMBULATORY_CARE_PROVIDER_SITE_OTHER): Payer: BC Managed Care – PPO | Admitting: Osteopathic Medicine

## 2020-06-02 VITALS — BP 127/80 | HR 60 | Temp 97.6°F | Wt 184.0 lb

## 2020-06-02 DIAGNOSIS — E038 Other specified hypothyroidism: Secondary | ICD-10-CM

## 2020-06-02 DIAGNOSIS — Z0000 Encounter for general adult medical examination without abnormal findings: Secondary | ICD-10-CM | POA: Diagnosis not present

## 2020-06-02 DIAGNOSIS — F988 Other specified behavioral and emotional disorders with onset usually occurring in childhood and adolescence: Secondary | ICD-10-CM | POA: Diagnosis not present

## 2020-06-02 MED ORDER — AMPHETAMINE-DEXTROAMPHETAMINE 30 MG PO TABS: 30.0000 mg | ORAL_TABLET | Freq: Two times a day (BID) | ORAL | 0 refills | Status: DC

## 2020-06-02 MED ORDER — AMPHETAMINE-DEXTROAMPHETAMINE 30 MG PO TABS
30.0000 mg | ORAL_TABLET | Freq: Two times a day (BID) | ORAL | 0 refills | Status: DC
Start: 2020-07-02 — End: 2020-09-03

## 2020-06-02 MED ORDER — AMPHETAMINE-DEXTROAMPHETAMINE 30 MG PO TABS
30.0000 mg | ORAL_TABLET | Freq: Two times a day (BID) | ORAL | 0 refills | Status: DC
Start: 2020-08-01 — End: 2020-09-02

## 2020-06-02 NOTE — Progress Notes (Signed)
Wayne Newman is a 45 y.o. male who presents to  [facility] at [facility]  today, 06/02/20, seeking care for the following:  . Annual physical  . Refill ADHD Rx    BP Readings from Last 3 Encounters:  06/02/20 127/80  09/14/19 123/75  09/13/18 121/74      ASSESSMENT & PLAN with other pertinent findings:  The primary encounter diagnosis was Annual physical exam. Diagnoses of Attention deficit disorder, unspecified hyperactivity presence and Other specified hypothyroidism were also pertinent to this visit.    Patient Instructions  General Preventive Care  Most recent routine screening labs: ordered today.   Blood pressure goal 130/80 or less.   Tobacco: don't!   Alcohol: responsible moderation is ok for most adults - if you have concerns about your alcohol intake, please talk to me!   Exercise: as tolerated to reduce risk of cardiovascular disease and diabetes. Strength training will also prevent osteoporosis.   Mental health: if need for mental health care (medicines, counseling, other), or concerns about moods, please let me know!   Sexual / Reproductive health: if need for STD testing, or if concerns with libido/pain problems, please let me know! If you need to discuss family planning, please let me know!   Advanced Directive: Living Will and/or Healthcare Power of Attorney recommended for all adults, regardless of age or health.  Vaccines  Flu vaccine: for almost everyone, every fall.   Shingles vaccine: after age 26.   Pneumonia vaccines: after age 32.   Tetanus booster: every 10 years, due 2028  COVID vaccine: THANKS for getting your vaccine and booster! :)  Cancer screenings   Colon cancer screening: for everyone age 25+  Prostate cancer screening: PSA blood test age 27+  Lung cancer screening: not needed for non-smokers  Infection screenings  . HIV: recommended screening at least once age 42-65, more often as  needed. Nanetta Batty, other STI: screening as needed . Hepatitis C: recommended once for everyone age 45-75 . TB: certain at-risk populations, or depending on work requirements and/or travel history Other . Bone Density Test: recommended for men at age 38 . Abdominal Aortic Aneurysm: screening with ultrasound recommended once for men age 37-75 who have ever smoked    Orders Placed This Encounter  Procedures  . CBC  . COMPLETE METABOLIC PANEL WITH GFR  . Lipid panel  . TSH    Meds ordered this encounter  Medications  . amphetamine-dextroamphetamine (ADDERALL) 30 MG tablet    Sig: Take 1 tablet by mouth 2 (two) times daily. Rx: 3/3- Please call office to arrange 3 another mos refills    Dispense:  60 tablet    Refill:  0  . amphetamine-dextroamphetamine (ADDERALL) 30 MG tablet    Sig: Take 1 tablet by mouth 2 (two) times daily. Rx: 2/3    Dispense:  60 tablet    Refill:  0  . amphetamine-dextroamphetamine (ADDERALL) 30 MG tablet    Sig: Take 1 tablet by mouth 2 (two) times daily. RX: 1/3    Dispense:  60 tablet    Refill:  0     See below for relevant physical exam findings  See below for recent lab and imaging results reviewed  Medications, allergies, PMH, PSH, SocH, FamH reviewed below    Follow-up instructions: Return in about 6 months (around 12/03/2020) for montiro BP on ADHD Rx and if ok can refill another 6 mos. Annual physican in 1 year (05/2021).  Exam:  BP 127/80 (BP Location: Left Arm, Patient Position: Sitting, Cuff Size: Normal)   Pulse 60   Temp 97.6 F (36.4 C) (Oral)   Wt 184 lb (83.5 kg)   BMI 24.95 kg/m   Constitutional: VS see above. General Appearance: alert, well-developed, well-nourished, NAD  Neck: No masses, trachea midline.   Respiratory: Normal respiratory effort. no wheeze, no rhonchi, no rales  Cardiovascular: S1/S2 normal, no murmur, no rub/gallop  auscultated. RRR.   Musculoskeletal: Gait normal. Symmetric and independent movement of all extremities  Abdominal: non-tender, non-distended, no appreciable organomegaly, neg Murphy's, BS [id number]  Neurological: Normal balance/coordination. No tremor.  Skin: warm, dry, intact.   Psychiatric: Normal judgment/insight. Normal mood and affect. Oriented x3.   Current Meds  Medication Sig  . levothyroxine (SYNTHROID) 300 MCG tablet Take 1 tablet (300 mcg total) by mouth daily before breakfast. Generic please and thanks!  . [DISCONTINUED] amphetamine-dextroamphetamine (ADDERALL) 30 MG tablet Take 1 tablet by mouth 2 (two) times daily. Rx: 3/3- Please schedule follow-up appointment for future refills.    Allergies  Allergen Reactions  . Elemental Sulfur Rash and Other (See Comments)    Had a sulfur abx for pink eye and made eyes more irritated and red   . Sulfa Antibiotics Other (See Comments)    Given for pink eye & irritated eye more.    Patient Active Problem List   Diagnosis Date Noted  . Flu vaccine need 09/14/2019  . Eczema 04/23/2015  . Abdominal pain, epigastric 01/02/2015  . Mild sleep apnea 02/20/2013  . Fatigue 12/20/2012  . Elevated LFTs 09/29/2012  . ADD (attention deficit disorder) 08/24/2012  . Hypothyroid 08/24/2012    Family History  Problem Relation Age of Onset  . Alcohol abuse Unknown        grandfather  . Heart attack Unknown        grandfather  . Stroke Unknown        grandfather    Social History   Tobacco Use  Smoking Status Never Smoker  Smokeless Tobacco Never Used    No past surgical history on file.  Immunization History  Administered Date(s) Administered  . Influenza,inj,Quad PF,6+ Mos 09/29/2012, 01/31/2015, 02/16/2016, 10/05/2016, 09/20/2017, 11/22/2018, 09/14/2019  . Influenza-Unspecified 10/11/2008  . PFIZER(Purple Top)SARS-COV-2 Vaccination 03/29/2019, 04/19/2019, 10/31/2019  . Td 01/12/1995  . Tdap 01/31/2008, 08/27/2016     No results found for this or any previous visit (from the past 2160 hour(s)).  No results found.     All questions at time of visit were answered - patient instructed to contact office with any additional concerns or updates. ER/RTC precautions were reviewed with the patient as applicable.   Please note: manual typing as well as voice recognition software may have been used to produce this document - typos may escape review. Please contact Dr. Lyn Hollingshead for any needed clarifications.

## 2020-06-02 NOTE — Patient Instructions (Signed)
General Preventive Care  Most recent routine screening labs: ordered today.   Blood pressure goal 130/80 or less.   Tobacco: don't!   Alcohol: responsible moderation is ok for most adults - if you have concerns about your alcohol intake, please talk to me!   Exercise: as tolerated to reduce risk of cardiovascular disease and diabetes. Strength training will also prevent osteoporosis.   Mental health: if need for mental health care (medicines, counseling, other), or concerns about moods, please let me know!   Sexual / Reproductive health: if need for STD testing, or if concerns with libido/pain problems, please let me know! If you need to discuss family planning, please let me know!   Advanced Directive: Living Will and/or Healthcare Power of Attorney recommended for all adults, regardless of age or health.  Vaccines  Flu vaccine: for almost everyone, every fall.   Shingles vaccine: after age 57.   Pneumonia vaccines: after age 35.   Tetanus booster: every 10 years, due 2028  COVID vaccine: THANKS for getting your vaccine and booster! :)  Cancer screenings   Colon cancer screening: for everyone age 8+  Prostate cancer screening: PSA blood test age 75+  Lung cancer screening: not needed for non-smokers  Infection screenings  . HIV: recommended screening at least once age 33-65, more often as needed. Nanetta Batty, other STI: screening as needed . Hepatitis C: recommended once for everyone age 21-75 . TB: certain at-risk populations, or depending on work requirements and/or travel history Other . Bone Density Test: recommended for men at age 31 . Abdominal Aortic Aneurysm: screening with ultrasound recommended once for men age 38-75 who have ever smoked

## 2020-06-03 DIAGNOSIS — F4323 Adjustment disorder with mixed anxiety and depressed mood: Secondary | ICD-10-CM | POA: Diagnosis not present

## 2020-06-03 LAB — CBC
HCT: 48.2 % (ref 38.5–50.0)
Hemoglobin: 16.3 g/dL (ref 13.2–17.1)
MCH: 31.2 pg (ref 27.0–33.0)
MCHC: 33.8 g/dL (ref 32.0–36.0)
MCV: 92.2 fL (ref 80.0–100.0)
MPV: 11 fL (ref 7.5–12.5)
Platelets: 198 10*3/uL (ref 140–400)
RBC: 5.23 10*6/uL (ref 4.20–5.80)
RDW: 11.8 % (ref 11.0–15.0)
WBC: 7 10*3/uL (ref 3.8–10.8)

## 2020-06-03 LAB — COMPLETE METABOLIC PANEL WITH GFR
AG Ratio: 2.1 (calc) (ref 1.0–2.5)
ALT: 38 U/L (ref 9–46)
AST: 25 U/L (ref 10–40)
Albumin: 4.7 g/dL (ref 3.6–5.1)
Alkaline phosphatase (APISO): 61 U/L (ref 36–130)
BUN: 14 mg/dL (ref 7–25)
CO2: 29 mmol/L (ref 20–32)
Calcium: 10.2 mg/dL (ref 8.6–10.3)
Chloride: 105 mmol/L (ref 98–110)
Creat: 0.92 mg/dL (ref 0.60–1.35)
GFR, Est African American: 118 mL/min/{1.73_m2} (ref >=60)
GFR, Est Non African American: 102 mL/min/{1.73_m2} (ref >=60)
Globulin: 2.2 g/dL (calc) (ref 1.9–3.7)
Glucose, Bld: 84 mg/dL (ref 65–99)
Potassium: 4.8 mmol/L (ref 3.5–5.3)
Sodium: 140 mmol/L (ref 135–146)
Total Bilirubin: 0.8 mg/dL (ref 0.2–1.2)
Total Protein: 6.9 g/dL (ref 6.1–8.1)

## 2020-06-03 LAB — TSH: TSH: 0.78 mIU/L (ref 0.40–4.50)

## 2020-06-03 LAB — LIPID PANEL
Cholesterol: 172 mg/dL (ref <200)
HDL: 56 mg/dL (ref >=40)
LDL Cholesterol (Calc): 89 mg/dL (calc)
Non-HDL Cholesterol (Calc): 116 mg/dL (calc) (ref <130)
Total CHOL/HDL Ratio: 3.1 (calc) (ref <5.0)
Triglycerides: 168 mg/dL — ABNORMAL HIGH (ref <150)

## 2020-06-13 DIAGNOSIS — F4323 Adjustment disorder with mixed anxiety and depressed mood: Secondary | ICD-10-CM | POA: Diagnosis not present

## 2020-06-18 DIAGNOSIS — F4323 Adjustment disorder with mixed anxiety and depressed mood: Secondary | ICD-10-CM | POA: Diagnosis not present

## 2020-06-23 DIAGNOSIS — F4323 Adjustment disorder with mixed anxiety and depressed mood: Secondary | ICD-10-CM | POA: Diagnosis not present

## 2020-06-24 DIAGNOSIS — F4323 Adjustment disorder with mixed anxiety and depressed mood: Secondary | ICD-10-CM | POA: Diagnosis not present

## 2020-06-25 DIAGNOSIS — F4323 Adjustment disorder with mixed anxiety and depressed mood: Secondary | ICD-10-CM | POA: Diagnosis not present

## 2020-06-30 DIAGNOSIS — F4323 Adjustment disorder with mixed anxiety and depressed mood: Secondary | ICD-10-CM | POA: Diagnosis not present

## 2020-07-02 DIAGNOSIS — F4323 Adjustment disorder with mixed anxiety and depressed mood: Secondary | ICD-10-CM | POA: Diagnosis not present

## 2020-07-09 DIAGNOSIS — F4323 Adjustment disorder with mixed anxiety and depressed mood: Secondary | ICD-10-CM | POA: Diagnosis not present

## 2020-07-29 ENCOUNTER — Other Ambulatory Visit: Payer: Self-pay | Admitting: Nurse Practitioner

## 2020-07-29 DIAGNOSIS — F988 Other specified behavioral and emotional disorders with onset usually occurring in childhood and adolescence: Secondary | ICD-10-CM

## 2020-07-30 ENCOUNTER — Other Ambulatory Visit: Payer: Self-pay | Admitting: Nurse Practitioner

## 2020-07-30 DIAGNOSIS — F988 Other specified behavioral and emotional disorders with onset usually occurring in childhood and adolescence: Secondary | ICD-10-CM

## 2020-07-31 DIAGNOSIS — F4323 Adjustment disorder with mixed anxiety and depressed mood: Secondary | ICD-10-CM | POA: Diagnosis not present

## 2020-08-07 DIAGNOSIS — F4323 Adjustment disorder with mixed anxiety and depressed mood: Secondary | ICD-10-CM | POA: Diagnosis not present

## 2020-08-14 DIAGNOSIS — F4323 Adjustment disorder with mixed anxiety and depressed mood: Secondary | ICD-10-CM | POA: Diagnosis not present

## 2020-08-21 DIAGNOSIS — F4323 Adjustment disorder with mixed anxiety and depressed mood: Secondary | ICD-10-CM | POA: Diagnosis not present

## 2020-08-28 DIAGNOSIS — F4323 Adjustment disorder with mixed anxiety and depressed mood: Secondary | ICD-10-CM | POA: Diagnosis not present

## 2020-09-02 ENCOUNTER — Other Ambulatory Visit: Payer: Self-pay | Admitting: Osteopathic Medicine

## 2020-09-02 DIAGNOSIS — F988 Other specified behavioral and emotional disorders with onset usually occurring in childhood and adolescence: Secondary | ICD-10-CM

## 2020-09-03 MED ORDER — AMPHETAMINE-DEXTROAMPHETAMINE 30 MG PO TABS: 30.0000 mg | ORAL_TABLET | Freq: Two times a day (BID) | ORAL | 0 refills | Status: DC

## 2020-09-03 MED ORDER — AMPHETAMINE-DEXTROAMPHETAMINE 30 MG PO TABS
30.0000 mg | ORAL_TABLET | Freq: Two times a day (BID) | ORAL | 0 refills | Status: DC
Start: 2020-10-03 — End: 2020-09-18

## 2020-09-03 MED ORDER — AMPHETAMINE-DEXTROAMPHETAMINE 30 MG PO TABS
30.0000 mg | ORAL_TABLET | Freq: Two times a day (BID) | ORAL | 0 refills | Status: DC
Start: 2020-11-02 — End: 2020-09-18

## 2020-09-04 DIAGNOSIS — F4323 Adjustment disorder with mixed anxiety and depressed mood: Secondary | ICD-10-CM | POA: Diagnosis not present

## 2020-09-11 DIAGNOSIS — F4323 Adjustment disorder with mixed anxiety and depressed mood: Secondary | ICD-10-CM | POA: Diagnosis not present

## 2020-09-18 ENCOUNTER — Encounter: Payer: Self-pay | Admitting: Osteopathic Medicine

## 2020-09-18 DIAGNOSIS — F988 Other specified behavioral and emotional disorders with onset usually occurring in childhood and adolescence: Secondary | ICD-10-CM

## 2020-09-18 MED ORDER — AMPHETAMINE-DEXTROAMPHETAMINE 30 MG PO TABS: 30.0000 mg | ORAL_TABLET | Freq: Two times a day (BID) | ORAL | 0 refills | Status: DC

## 2020-09-18 NOTE — Telephone Encounter (Signed)
Adderall has been on backorder a few places, need to cancel rx at [facility], I can send to [facility] as requested

## 2020-10-31 ENCOUNTER — Other Ambulatory Visit: Payer: Self-pay

## 2020-10-31 DIAGNOSIS — F988 Other specified behavioral and emotional disorders with onset usually occurring in childhood and adolescence: Secondary | ICD-10-CM

## 2020-10-31 MED ORDER — AMPHETAMINE-DEXTROAMPHETAMINE 30 MG PO TABS: 30.0000 mg | ORAL_TABLET | Freq: Two times a day (BID) | ORAL | 0 refills | Status: DC

## 2020-10-31 NOTE — Telephone Encounter (Signed)
Patient has an appointment on November 21st with Dr. Ashley Royalty.

## 2020-10-31 NOTE — Telephone Encounter (Signed)
Pt requesting refill.  He due for an OV OV scheduled with Dr. Ashley Royalty for 12/01/2020 at 8:50 AM

## 2020-11-07 ENCOUNTER — Other Ambulatory Visit: Payer: Self-pay | Admitting: Medical-Surgical

## 2020-11-07 ENCOUNTER — Other Ambulatory Visit: Payer: Self-pay

## 2020-11-07 DIAGNOSIS — F988 Other specified behavioral and emotional disorders with onset usually occurring in childhood and adolescence: Secondary | ICD-10-CM

## 2020-11-07 MED ORDER — AMPHETAMINE-DEXTROAMPHETAMINE 30 MG PO TABS: 30.0000 mg | ORAL_TABLET | Freq: Two times a day (BID) | ORAL | 0 refills | Status: DC

## 2020-11-07 NOTE — Progress Notes (Signed)
Called [facility] and canceled prescription. Rx pended.

## 2020-11-27 ENCOUNTER — Ambulatory Visit: Payer: BC Managed Care – PPO | Admitting: Family Medicine

## 2020-12-01 ENCOUNTER — Other Ambulatory Visit: Payer: Self-pay

## 2020-12-01 ENCOUNTER — Ambulatory Visit (INDEPENDENT_AMBULATORY_CARE_PROVIDER_SITE_OTHER): Payer: BC Managed Care – PPO | Admitting: Family Medicine

## 2020-12-01 ENCOUNTER — Encounter: Payer: Self-pay | Admitting: Family Medicine

## 2020-12-01 VITALS — BP 98/68 | HR 97 | Ht 72.0 in | Wt 180.7 lb

## 2020-12-01 DIAGNOSIS — M5430 Sciatica, unspecified side: Secondary | ICD-10-CM | POA: Insufficient documentation

## 2020-12-01 DIAGNOSIS — F988 Other specified behavioral and emotional disorders with onset usually occurring in childhood and adolescence: Secondary | ICD-10-CM | POA: Diagnosis not present

## 2020-12-01 DIAGNOSIS — M5432 Sciatica, left side: Secondary | ICD-10-CM

## 2020-12-01 MED ORDER — METHYLPREDNISOLONE ACETATE 80 MG/ML IJ SUSP
80.0000 mg | Freq: Once | INTRAMUSCULAR | Status: AC
  Administered 2020-12-01: 80 mg via INTRAMUSCULAR

## 2020-12-01 MED ORDER — AMPHETAMINE-DEXTROAMPHETAMINE 30 MG PO TABS: 30.0000 mg | ORAL_TABLET | Freq: Two times a day (BID) | ORAL | 0 refills | Status: DC

## 2020-12-01 MED ORDER — PREDNISONE 50 MG PO TABS: ORAL_TABLET | ORAL | 0 refills | Status: DC

## 2020-12-01 NOTE — Progress Notes (Signed)
Wayne Newman - 45 y.o. male MRN [id number]  Date of birth: January 27, 1975  Subjective Chief Complaint  Patient presents with   Back Pain   Leg Pain    HPI Wayne Newman is a 45 year old male here today for follow-up of ADD.  He also has complaint of sciatic pain.  He reports sciatic pain started approximately 2 weeks ago.  He denies any known injury or overuse.  He is an avid runner.  He does have intermittent numbness and tingling feeling.  He does have increased pain with sitting.  He he feels more tight and stiff when standing after sitting for prolonged period.  He has tried some stretches that were given to help him by physical therapy when he had back pain previously.  He does continue to do well with current dose of Adderall.  He has not noted any significant side effects with this.  ROS:  A comprehensive ROS was completed and negative except as noted per HPI   Allergies  Allergen Reactions   Elemental Sulfur Rash and Other (See Comments)    Had a sulfur abx for pink eye and made eyes more irritated and red    Sulfa Antibiotics Other (See Comments)    Given for pink eye & irritated eye more.    Past Medical History:  Diagnosis Date   Thyroid disease     History reviewed. No pertinent surgical history.  Social History   Socioeconomic History   Marital status: Married    Spouse name: Not on file   Number of children: Not on file   Years of education: Not on file   Highest education level: Not on file  Occupational History   Not on file  Tobacco Use   Smoking status: Never   Smokeless tobacco: Never  Substance and Sexual Activity   Alcohol use: Yes   Drug use: No   Sexual activity: Yes    Birth control/protection: Condom  Other Topics Concern   Not on file  Social History Narrative   Not on file   Social Determinants of Health   Financial Resource Strain: Not on file  Food Insecurity: Not on file  Transportation Needs: Not on file  Physical Activity: Not on file   Stress: Not on file  Social Connections: Not on file    Family History  Problem Relation Age of Onset   Alcohol abuse Unknown        grandfather   Heart attack Unknown        grandfather   Stroke Unknown        grandfather    Health Maintenance  Topic Date Due   HIV Screening  Never done   Hepatitis C Screening  Never done   COVID-19 Vaccine (4 - Booster for Pfizer series) 12/26/2019   INFLUENZA VACCINE  08/11/2020   TETANUS/TDAP  08/28/2026   Pneumococcal Vaccine 59-54 Years old  Aged Out   HPV VACCINES  Aged Out     ----------------------------------------------------------------------------------------------------------------------------------------------------------------------------------------------------------------- Physical Exam BP 98/68 (BP Location: Left Arm, Patient Position: Sitting, Cuff Size: Normal)   Pulse 97   Ht 6' (1.829 m)   Wt 180 lb 11.2 oz (82 kg)   SpO2 98%   BMI 24.51 kg/m   Physical Exam Constitutional:      Appearance: Normal appearance.  Eyes:     General: No scleral icterus. Cardiovascular:     Rate and Rhythm: Normal rate and regular rhythm.  Pulmonary:     Effort: Pulmonary effort is normal.  Breath sounds: Normal breath sounds.  Musculoskeletal:     Cervical back: Neck supple.     Comments: Mild tenderness to palpation along the lower lumbar paraspinals and gluteal muscles.  He has positive straight leg raise on the left.  Neurological:     General: No focal deficit present.     Mental Status: He is alert.  Psychiatric:        Mood and Affect: Mood normal.        Behavior: Behavior normal.    ------------------------------------------------------------------------------------------------------------------------------------------------------------------------------------------------------------------- Assessment and Plan  Sciatica Given injection of Depo-Medrol we will continue a 5-day course of prednisone.  He will  continue current home exercise program.  He will let me know if not improving.  ADD (attention deficit disorder) Doing well with Adderall at current strength.  We will plan to continue.  Prescription renewed.  We   Meds ordered this encounter  Medications   predniSONE (DELTASONE) 50 MG tablet    Sig: Take 1 tablet PO daily x5 days.    Dispense:  5 tablet    Refill:  0   amphetamine-dextroamphetamine (ADDERALL) 30 MG tablet    Sig: Take 1 tablet by mouth 2 (two) times daily. Rx: 3/3-    Dispense:  60 tablet    Refill:  0   amphetamine-dextroamphetamine (ADDERALL) 30 MG tablet    Sig: Take 1 tablet by mouth 2 (two) times daily. Rx: 2/3    Dispense:  60 tablet    Refill:  0   amphetamine-dextroamphetamine (ADDERALL) 30 MG tablet    Sig: Take 1 tablet by mouth 2 (two) times daily. RX: 1/3    Dispense:  60 tablet    Refill:  0   methylPREDNISolone acetate (DEPO-MEDROL) injection 80 mg    Return in about 6 months (around 05/31/2021) for ADD.    This visit occurred during the SARS-CoV-2 public health emergency.  Safety protocols were in place, including screening questions prior to the visit, additional usage of staff PPE, and extensive cleaning of exam room while observing appropriate contact time as indicated for disinfecting solutions.

## 2020-12-01 NOTE — Patient Instructions (Signed)

## 2020-12-01 NOTE — Assessment & Plan Note (Signed)
Given injection of Depo-Medrol we will continue a 5-day course of prednisone.  He will continue current home exercise program.  He will let me know if not improving.

## 2020-12-01 NOTE — Assessment & Plan Note (Signed)
Doing well with Adderall at current strength.  We will plan to continue.  Prescription renewed.

## 2020-12-08 ENCOUNTER — Ambulatory Visit: Payer: BC Managed Care – PPO | Admitting: Osteopathic Medicine

## 2020-12-09 DIAGNOSIS — M5417 Radiculopathy, lumbosacral region: Secondary | ICD-10-CM | POA: Diagnosis not present

## 2020-12-11 ENCOUNTER — Other Ambulatory Visit: Payer: Self-pay | Admitting: Family Medicine

## 2020-12-11 DIAGNOSIS — F988 Other specified behavioral and emotional disorders with onset usually occurring in childhood and adolescence: Secondary | ICD-10-CM

## 2020-12-11 MED ORDER — AMPHETAMINE-DEXTROAMPHETAMINE 30 MG PO TABS: 30.0000 mg | ORAL_TABLET | Freq: Two times a day (BID) | ORAL | 0 refills | Status: DC

## 2020-12-12 ENCOUNTER — Encounter: Payer: Self-pay | Admitting: Family Medicine

## 2020-12-12 DIAGNOSIS — F988 Other specified behavioral and emotional disorders with onset usually occurring in childhood and adolescence: Secondary | ICD-10-CM

## 2020-12-18 DIAGNOSIS — M4727 Other spondylosis with radiculopathy, lumbosacral region: Secondary | ICD-10-CM | POA: Diagnosis not present

## 2020-12-18 MED ORDER — AMPHETAMINE-DEXTROAMPHETAMINE 30 MG PO TABS: 30.0000 mg | ORAL_TABLET | Freq: Two times a day (BID) | ORAL | 0 refills | Status: DC

## 2020-12-18 NOTE — Addendum Note (Signed)
Addended by: Stan Head on: 12/18/2020 08:13 AM   Modules accepted: Orders

## 2020-12-29 DIAGNOSIS — M5417 Radiculopathy, lumbosacral region: Secondary | ICD-10-CM | POA: Diagnosis not present

## 2021-01-05 ENCOUNTER — Encounter: Payer: Self-pay | Admitting: Family Medicine

## 2021-01-06 DIAGNOSIS — M5417 Radiculopathy, lumbosacral region: Secondary | ICD-10-CM | POA: Diagnosis not present

## 2021-01-06 DIAGNOSIS — M5127 Other intervertebral disc displacement, lumbosacral region: Secondary | ICD-10-CM | POA: Diagnosis not present

## 2021-01-08 DIAGNOSIS — R52 Pain, unspecified: Secondary | ICD-10-CM | POA: Diagnosis not present

## 2021-01-08 DIAGNOSIS — M5442 Lumbago with sciatica, left side: Secondary | ICD-10-CM | POA: Diagnosis not present

## 2021-01-08 DIAGNOSIS — M5417 Radiculopathy, lumbosacral region: Secondary | ICD-10-CM | POA: Diagnosis not present

## 2021-01-08 DIAGNOSIS — M5126 Other intervertebral disc displacement, lumbar region: Secondary | ICD-10-CM | POA: Diagnosis not present

## 2021-01-16 ENCOUNTER — Other Ambulatory Visit: Payer: Self-pay | Admitting: Family Medicine

## 2021-01-16 DIAGNOSIS — F988 Other specified behavioral and emotional disorders with onset usually occurring in childhood and adolescence: Secondary | ICD-10-CM

## 2021-01-16 DIAGNOSIS — M5417 Radiculopathy, lumbosacral region: Secondary | ICD-10-CM | POA: Diagnosis not present

## 2021-01-20 ENCOUNTER — Encounter: Payer: Self-pay | Admitting: Family Medicine

## 2021-01-20 MED ORDER — AMPHETAMINE-DEXTROAMPHETAMINE 30 MG PO TABS: 30.0000 mg | ORAL_TABLET | Freq: Two times a day (BID) | ORAL | 0 refills | Status: DC

## 2021-01-21 DIAGNOSIS — R52 Pain, unspecified: Secondary | ICD-10-CM | POA: Diagnosis not present

## 2021-01-21 DIAGNOSIS — M5442 Lumbago with sciatica, left side: Secondary | ICD-10-CM | POA: Diagnosis not present

## 2021-01-21 DIAGNOSIS — M5417 Radiculopathy, lumbosacral region: Secondary | ICD-10-CM | POA: Diagnosis not present

## 2021-01-21 DIAGNOSIS — M5126 Other intervertebral disc displacement, lumbar region: Secondary | ICD-10-CM | POA: Diagnosis not present

## 2021-02-06 DIAGNOSIS — M5417 Radiculopathy, lumbosacral region: Secondary | ICD-10-CM | POA: Diagnosis not present

## 2021-02-06 DIAGNOSIS — M5126 Other intervertebral disc displacement, lumbar region: Secondary | ICD-10-CM | POA: Diagnosis not present

## 2021-02-19 ENCOUNTER — Other Ambulatory Visit: Payer: Self-pay | Admitting: Family Medicine

## 2021-02-19 DIAGNOSIS — F988 Other specified behavioral and emotional disorders with onset usually occurring in childhood and adolescence: Secondary | ICD-10-CM

## 2021-02-20 ENCOUNTER — Encounter: Payer: Self-pay | Admitting: Family Medicine

## 2021-02-20 MED ORDER — AMPHETAMINE-DEXTROAMPHETAMINE 30 MG PO TABS: 30.0000 mg | ORAL_TABLET | Freq: Two times a day (BID) | ORAL | 0 refills | Status: DC

## 2021-02-27 IMAGING — DX DG CHEST 2V
2 series · 2 of 2 positions shown · non-contrast
Comparison: None.

CLINICAL DATA: Fall 1 week ago.  Right chest pain

EXAM:
CHEST - 2 VIEW

[chest pa]
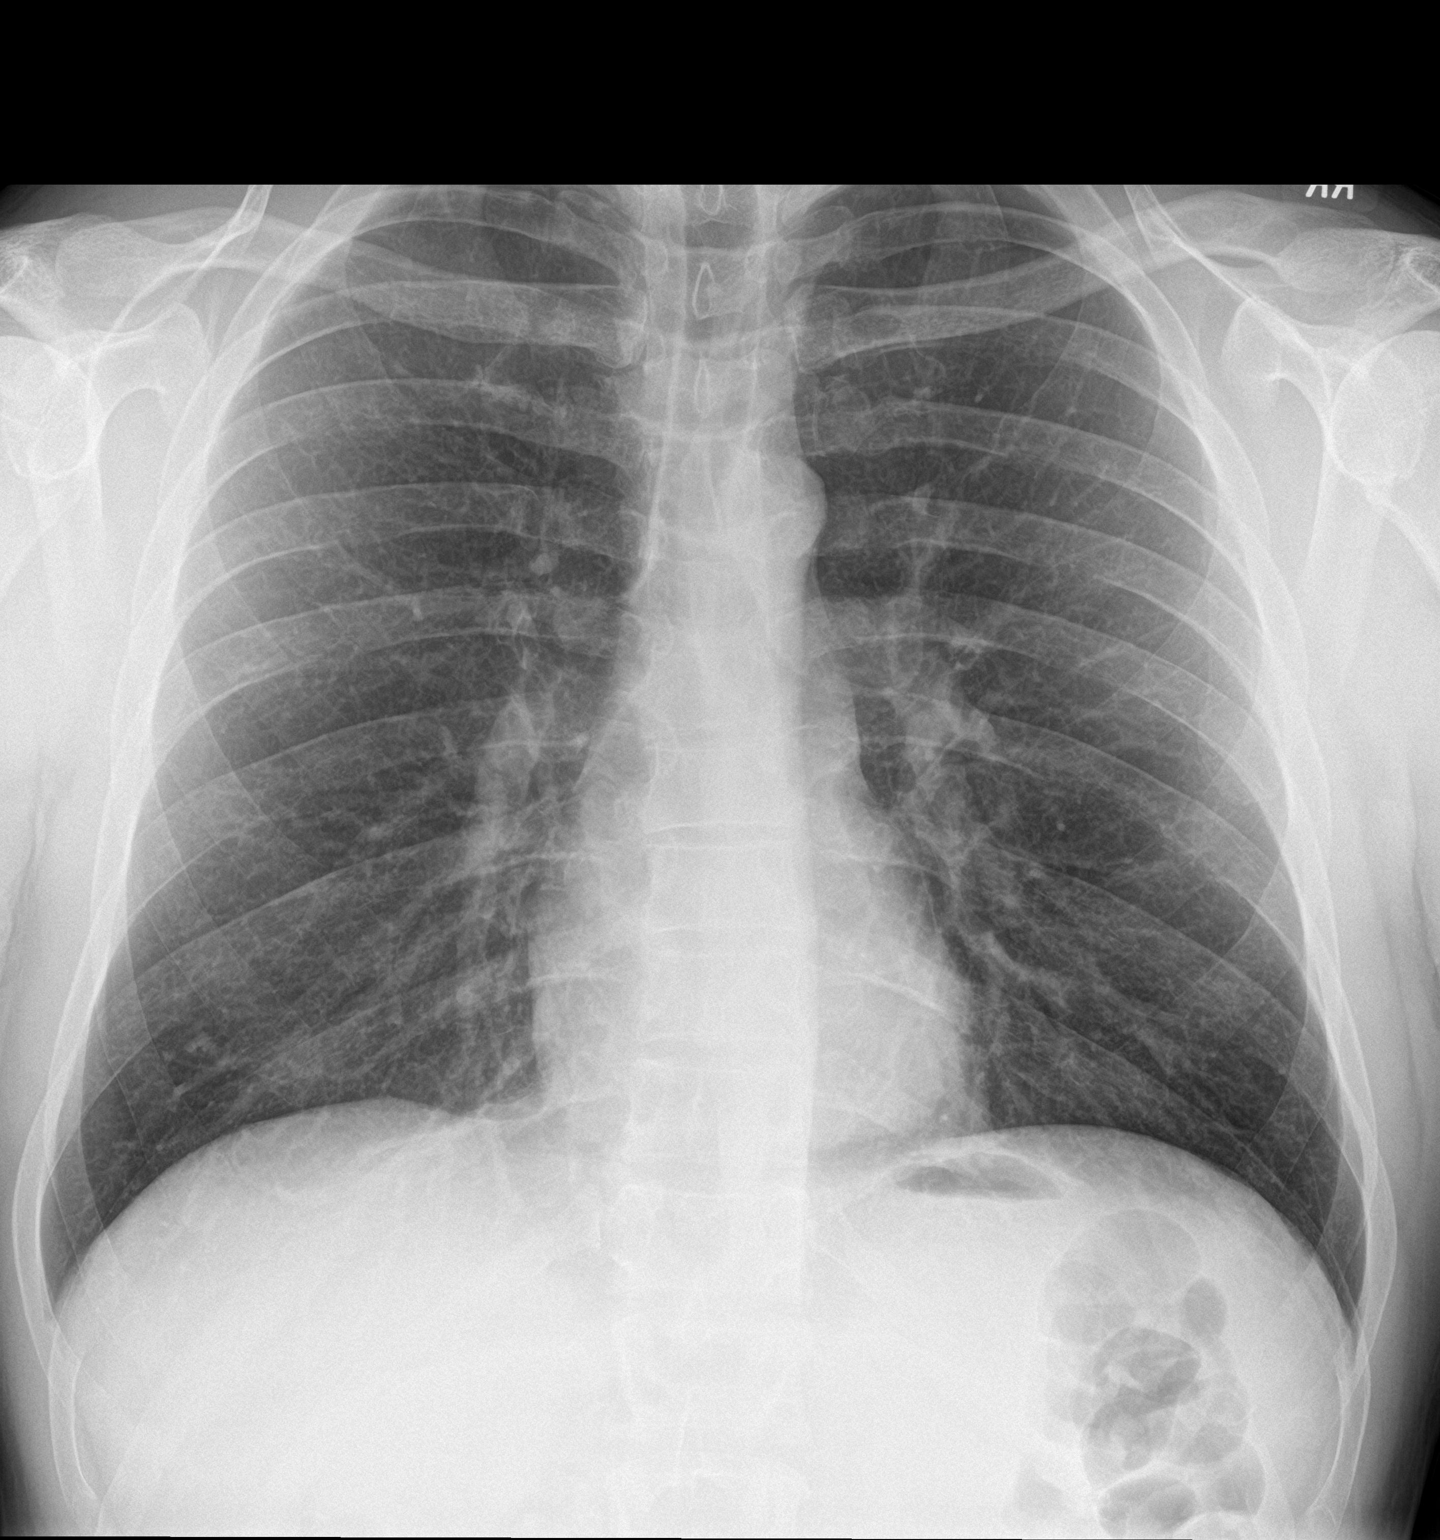

[chest lat]
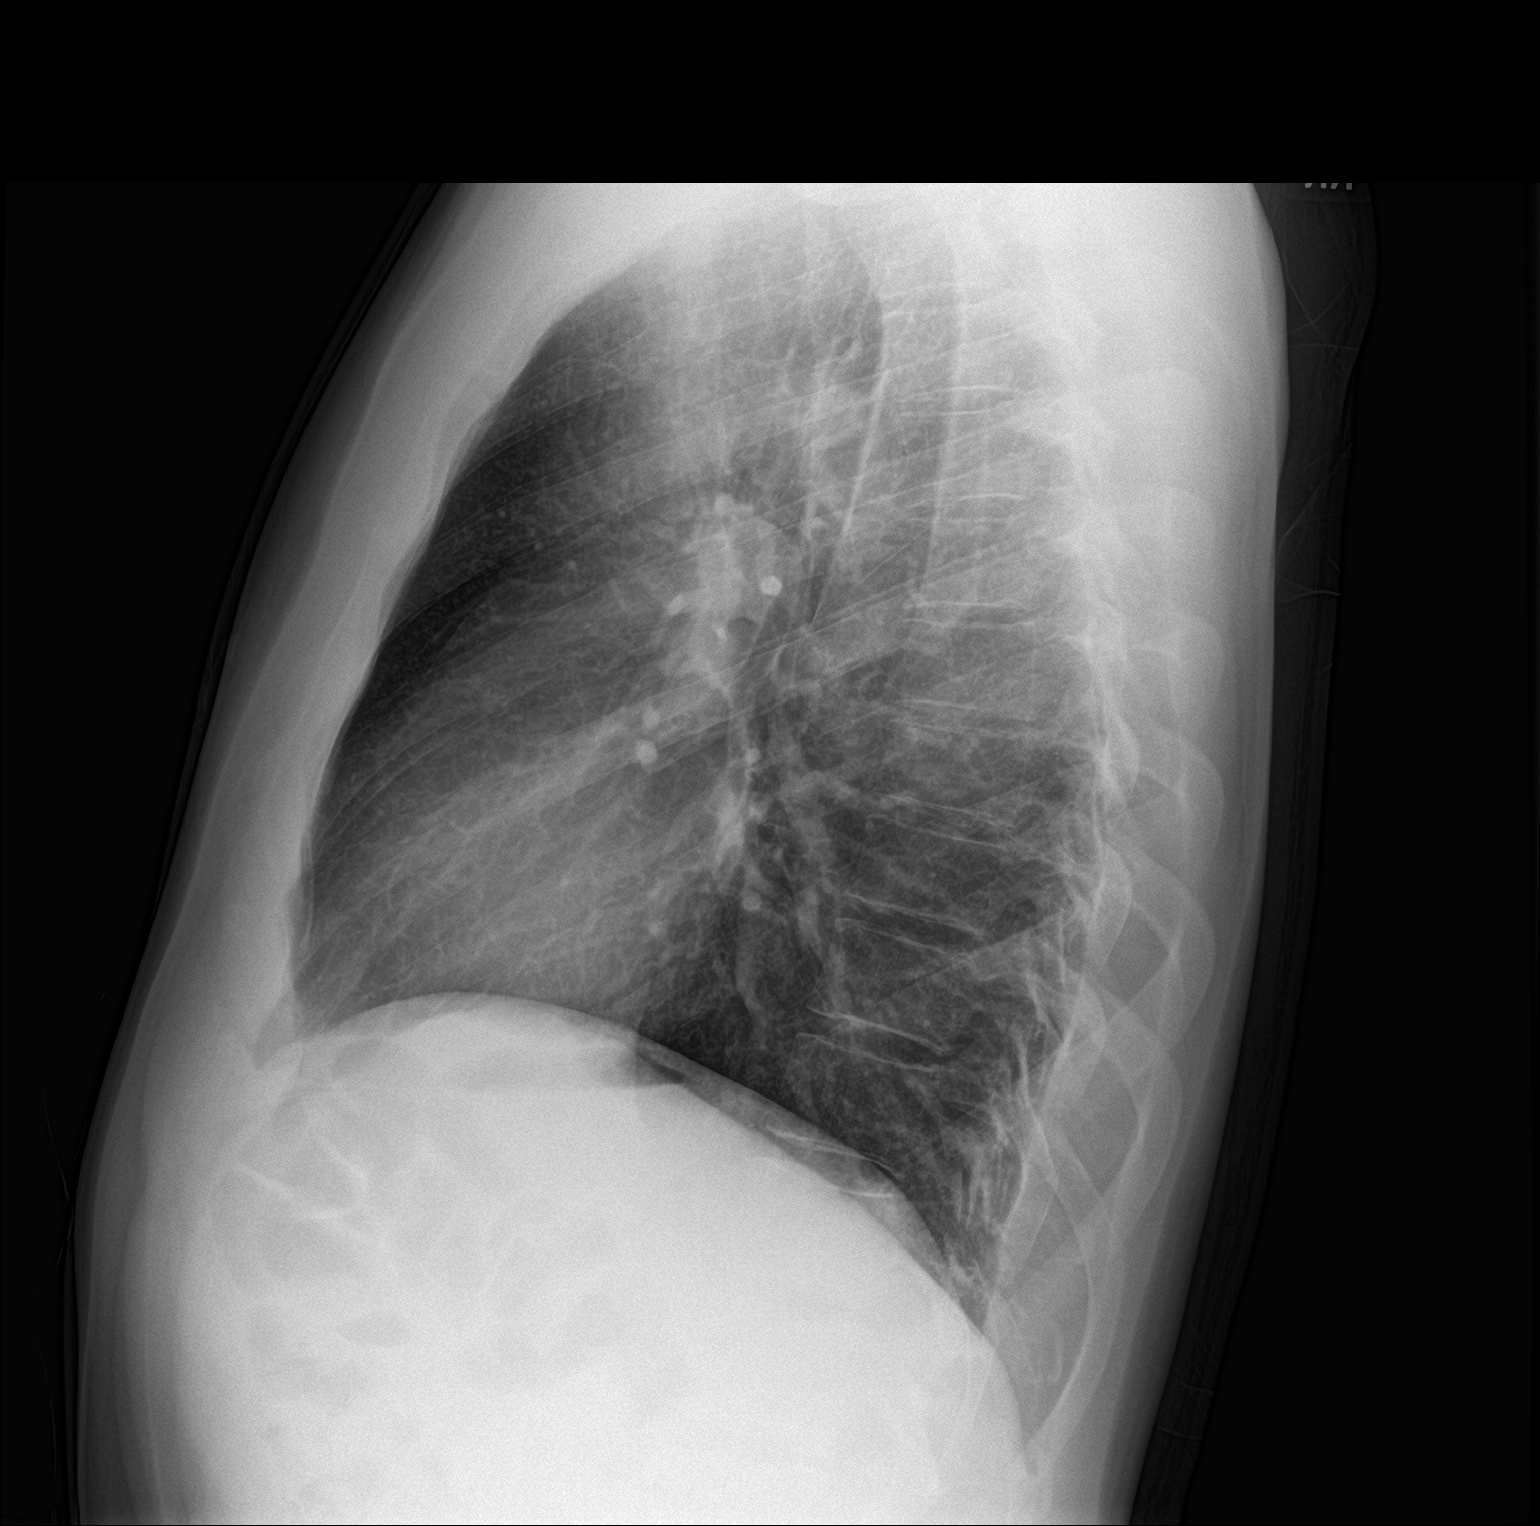

[2 of 2 positions shown; findings below may reference images not displayed]

FINDINGS: The heart size and mediastinal contours are within normal limits.
Both lungs are clear. The visualized skeletal structures are
unremarkable.
IMPRESSION: No active cardiopulmonary disease.

## 2021-03-20 ENCOUNTER — Other Ambulatory Visit: Payer: Self-pay | Admitting: Family Medicine

## 2021-03-20 DIAGNOSIS — F988 Other specified behavioral and emotional disorders with onset usually occurring in childhood and adolescence: Secondary | ICD-10-CM

## 2021-03-24 ENCOUNTER — Other Ambulatory Visit: Payer: Self-pay | Admitting: Family Medicine

## 2021-03-24 DIAGNOSIS — F988 Other specified behavioral and emotional disorders with onset usually occurring in childhood and adolescence: Secondary | ICD-10-CM

## 2021-03-25 ENCOUNTER — Encounter: Payer: Self-pay | Admitting: Family Medicine

## 2021-03-25 MED ORDER — AMPHETAMINE-DEXTROAMPHETAMINE 30 MG PO TABS: 30.0000 mg | ORAL_TABLET | Freq: Two times a day (BID) | ORAL | 0 refills | Status: DC

## 2021-04-22 ENCOUNTER — Other Ambulatory Visit: Payer: Self-pay | Admitting: Family Medicine

## 2021-04-22 DIAGNOSIS — F988 Other specified behavioral and emotional disorders with onset usually occurring in childhood and adolescence: Secondary | ICD-10-CM

## 2021-04-24 ENCOUNTER — Other Ambulatory Visit: Payer: Self-pay | Admitting: Family Medicine

## 2021-04-24 ENCOUNTER — Encounter: Payer: Self-pay | Admitting: Family Medicine

## 2021-04-24 DIAGNOSIS — F988 Other specified behavioral and emotional disorders with onset usually occurring in childhood and adolescence: Secondary | ICD-10-CM

## 2021-04-24 MED ORDER — AMPHETAMINE-DEXTROAMPHETAMINE 30 MG PO TABS: 30.0000 mg | ORAL_TABLET | Freq: Two times a day (BID) | ORAL | 0 refills | Status: DC

## 2021-05-07 ENCOUNTER — Encounter: Payer: Self-pay | Admitting: Family Medicine

## 2021-05-25 ENCOUNTER — Other Ambulatory Visit: Payer: Self-pay | Admitting: Family Medicine

## 2021-05-25 DIAGNOSIS — F988 Other specified behavioral and emotional disorders with onset usually occurring in childhood and adolescence: Secondary | ICD-10-CM

## 2021-05-25 MED ORDER — AMPHETAMINE-DEXTROAMPHETAMINE 30 MG PO TABS: 30.0000 mg | ORAL_TABLET | Freq: Two times a day (BID) | ORAL | 0 refills | Status: DC

## 2021-06-25 ENCOUNTER — Encounter: Payer: Self-pay | Admitting: Family Medicine

## 2021-06-25 ENCOUNTER — Other Ambulatory Visit: Payer: Self-pay | Admitting: Family Medicine

## 2021-06-25 DIAGNOSIS — F988 Other specified behavioral and emotional disorders with onset usually occurring in childhood and adolescence: Secondary | ICD-10-CM

## 2021-06-25 MED ORDER — AMPHETAMINE-DEXTROAMPHETAMINE 30 MG PO TABS: 30.0000 mg | ORAL_TABLET | Freq: Two times a day (BID) | ORAL | 0 refills | Status: DC

## 2021-06-25 NOTE — Telephone Encounter (Signed)
Updated rx sent

## 2021-06-25 NOTE — Telephone Encounter (Signed)
Patient has been scheduled for Dr Ashley Royalty next available on 07/16/21. AMUCK

## 2021-07-16 ENCOUNTER — Encounter: Payer: Self-pay | Admitting: Family Medicine

## 2021-07-16 ENCOUNTER — Ambulatory Visit: Payer: BC Managed Care – PPO | Admitting: Family Medicine

## 2021-07-16 DIAGNOSIS — F988 Other specified behavioral and emotional disorders with onset usually occurring in childhood and adolescence: Secondary | ICD-10-CM | POA: Diagnosis not present

## 2021-07-16 DIAGNOSIS — M25511 Pain in right shoulder: Secondary | ICD-10-CM

## 2021-07-16 MED ORDER — AMPHETAMINE-DEXTROAMPHETAMINE 30 MG PO TABS
30.0000 mg | ORAL_TABLET | Freq: Three times a day (TID) | ORAL | 0 refills | Status: DC
Start: 2021-07-16 — End: 2021-07-23

## 2021-07-16 MED ORDER — PREDNISONE 10 MG (48) PO TBPK: ORAL_TABLET | ORAL | 0 refills | Status: DC

## 2021-07-16 NOTE — Patient Instructions (Signed)
Let me know if there are issues with mail order.  You can try increasing adderall to 30mg  three times per day (morning, lunch, afternoon)  I have sent over prednisone for the shoulder.  Try the rehab exercises as well.

## 2021-07-16 NOTE — Progress Notes (Signed)
Wayne Newman - 46 y.o. male MRN [id number]  Date of birth: 12-07-1975  Subjective Chief Complaint  Patient presents with   Rt shoulder pain    HPI Wayne Newman is a 46 year old male here today for follow-up of ADHD.  He is also having some right shoulder pain.  Currently prescribed Adderall 30 mg IR twice daily.  He did try extended release previously and found that the IR twice daily worked better for him.  He does feel like the strength and dosing still is not quite right.  Feels like he may have built up tolerance as medication does not last him throughout the day.  He is not having any significant side effects from medication.   He is also having some right shoulder pain.  He has had this for couple weeks.  Initially started after pulling the rope in his lumbar engine to start it.  Denies any numbness and tingling.  He has not noted any significant weakness.  Pain is worse with abduction.  ROS:  A comprehensive ROS was completed and negative except as noted per HPI  Allergies  Allergen Reactions   Elemental Sulfur Rash and Other (See Comments)    Had a sulfur abx for pink eye and made eyes more irritated and red    Sulfa Antibiotics Other (See Comments)    Given for pink eye & irritated eye more.    Past Medical History:  Diagnosis Date   Thyroid disease     History reviewed. No pertinent surgical history.  Social History   Socioeconomic History   Marital status: Married    Spouse name: Not on file   Number of children: Not on file   Years of education: Not on file   Highest education level: Not on file  Occupational History   Not on file  Tobacco Use   Smoking status: Never   Smokeless tobacco: Never  Substance and Sexual Activity   Alcohol use: Yes   Drug use: No   Sexual activity: Yes    Birth control/protection: Condom  Other Topics Concern   Not on file  Social History Narrative   Not on file   Social Determinants of Health   Financial Resource Strain: Not  on file  Food Insecurity: Not on file  Transportation Needs: Not on file  Physical Activity: Not on file  Stress: Not on file  Social Connections: Not on file    Family History  Problem Relation Age of Onset   Alcohol abuse Unknown        grandfather   Heart attack Unknown        grandfather   Stroke Unknown        grandfather    Health Maintenance  Topic Date Due   COVID-19 Vaccine (4 - Pfizer series) 10/16/2021 (Originally 12/26/2019)   Hepatitis C Screening  07/17/2022 (Originally 08/31/1994)   HIV Screening  07/17/2022 (Originally 08/31/1991)   INFLUENZA VACCINE  08/11/2021   TETANUS/TDAP  08/28/2026   HPV VACCINES  Aged Out     ----------------------------------------------------------------------------------------------------------------------------------------------------------------------------------------------------------------- Physical Exam BP 117/73 (BP Location: Left Arm, Patient Position: Sitting, Cuff Size: Large)   Pulse 100   Ht 6' (1.829 m)   Wt 190 lb (86.2 kg)   SpO2 100%   BMI 25.77 kg/m   Physical Exam Constitutional:      Appearance: Normal appearance.  Eyes:     General: No scleral icterus. Cardiovascular:     Rate and Rhythm: Normal rate and regular rhythm.  Abdominal:  General: Abdomen is flat.     Palpations: Abdomen is soft.  Musculoskeletal:     Cervical back: Neck supple.  Neurological:     Mental Status: He is alert.     ------------------------------------------------------------------------------------------------------------------------------------------------------------------------------------------------------------------- Assessment and Plan  ADD (attention deficit disorder) He feels that Adderall 30 mg at twice daily has lost his efficacy.  He is having breakthrough symptoms resumed today.  Changing to 30 mg 3 times daily to see if this works better for him. Return in about 6 months (around 01/16/2022) for  ADD.   Right shoulder pain Strength is preserved.  Seems to have rotator cuff tendinitis.  Adding course of prednisone impregnated exercises for shoulder rehab.  He will let me know if not improving after this.   Meds ordered this encounter  Medications   amphetamine-dextroamphetamine (ADDERALL) 30 MG tablet    Sig: Take 1 tablet by mouth 3 (three) times daily.    Dispense:  270 tablet    Refill:  0   predniSONE (STERAPRED UNI-PAK 48 TAB) 10 MG (48) TBPK tablet    Sig: Taper as directed on packaging.  12 day taper.    Dispense:  48 tablet    Refill:  0    Return in about 6 months (around 01/16/2022) for ADD.    This visit occurred during the SARS-CoV-2 public health emergency.  Safety protocols were in place, including screening questions prior to the visit, additional usage of staff PPE, and extensive cleaning of exam room while observing appropriate contact time as indicated for disinfecting solutions.

## 2021-07-16 NOTE — Assessment & Plan Note (Signed)
Strength is preserved.  Seems to have rotator cuff tendinitis.  Adding course of prednisone impregnated exercises for shoulder rehab.  He will let me know if not improving after this.

## 2021-07-16 NOTE — Assessment & Plan Note (Signed)
He feels that Adderall 30 mg at twice daily has lost his efficacy.  He is having breakthrough symptoms resumed today.  Changing to 30 mg 3 times daily to see if this works better for him. Return in about 6 months (around 01/16/2022) for ADD.

## 2021-07-23 ENCOUNTER — Encounter: Payer: Self-pay | Admitting: Family Medicine

## 2021-07-23 ENCOUNTER — Other Ambulatory Visit: Payer: Self-pay | Admitting: Family Medicine

## 2021-07-23 DIAGNOSIS — F988 Other specified behavioral and emotional disorders with onset usually occurring in childhood and adolescence: Secondary | ICD-10-CM

## 2021-07-23 MED ORDER — AMPHETAMINE-DEXTROAMPHETAMINE 30 MG PO TABS: 30.0000 mg | ORAL_TABLET | Freq: Three times a day (TID) | ORAL | 0 refills | Status: DC

## 2021-07-23 NOTE — Telephone Encounter (Signed)
Duplicate request. Do not have access to denied controlled rx.

## 2021-09-01 ENCOUNTER — Other Ambulatory Visit: Payer: Self-pay | Admitting: Family Medicine

## 2021-09-01 DIAGNOSIS — F988 Other specified behavioral and emotional disorders with onset usually occurring in childhood and adolescence: Secondary | ICD-10-CM

## 2021-09-01 MED ORDER — AMPHETAMINE-DEXTROAMPHETAMINE 30 MG PO TABS: 30.0000 mg | ORAL_TABLET | Freq: Three times a day (TID) | ORAL | 0 refills | Status: DC

## 2021-09-01 NOTE — Telephone Encounter (Signed)
Last OV: 07/16/21 Next OV: none on file Last RF: 07/23/21

## 2021-09-10 ENCOUNTER — Encounter: Payer: Self-pay | Admitting: Family Medicine

## 2021-09-10 NOTE — Telephone Encounter (Signed)
Refill sent 09/01/21.  Have not received anything from his pharmacy.   CM

## 2021-10-09 DIAGNOSIS — F4322 Adjustment disorder with anxiety: Secondary | ICD-10-CM | POA: Diagnosis not present

## 2021-10-12 ENCOUNTER — Other Ambulatory Visit: Payer: Self-pay | Admitting: Family Medicine

## 2021-10-12 DIAGNOSIS — F988 Other specified behavioral and emotional disorders with onset usually occurring in childhood and adolescence: Secondary | ICD-10-CM

## 2021-10-14 MED ORDER — AMPHETAMINE-DEXTROAMPHETAMINE 30 MG PO TABS: 30.0000 mg | ORAL_TABLET | Freq: Three times a day (TID) | ORAL | 0 refills | Status: DC

## 2021-10-23 ENCOUNTER — Encounter: Payer: Self-pay | Admitting: Family Medicine

## 2021-10-23 DIAGNOSIS — F4323 Adjustment disorder with mixed anxiety and depressed mood: Secondary | ICD-10-CM | POA: Diagnosis not present

## 2021-10-23 NOTE — Telephone Encounter (Signed)
Recommend appt to discuss.  This can be virtual.   Thanks!  CM

## 2021-10-27 ENCOUNTER — Encounter: Payer: Self-pay | Admitting: Family Medicine

## 2021-10-27 ENCOUNTER — Other Ambulatory Visit: Payer: Self-pay | Admitting: Family Medicine

## 2021-10-27 ENCOUNTER — Telehealth (INDEPENDENT_AMBULATORY_CARE_PROVIDER_SITE_OTHER): Payer: BC Managed Care – PPO | Admitting: Family Medicine

## 2021-10-27 DIAGNOSIS — F411 Generalized anxiety disorder: Secondary | ICD-10-CM

## 2021-10-27 MED ORDER — FLUOXETINE HCL 20 MG PO TABS: 20.0000 mg | ORAL_TABLET | Freq: Every day | ORAL | 0 refills | Status: DC

## 2021-10-27 NOTE — Progress Notes (Signed)
Lawrnce Newman - 46 y.o. male MRN [id number]  Date of birth: 27-Sep-1975   This visit type was conducted due to national recommendations for restrictions regarding the COVID-19 Pandemic (e.g. social distancing).  This format is felt to be most appropriate for this patient at this time.  All issues noted in this document were discussed and addressed.  No physical exam was performed (except for noted visual exam findings with Video Visits).  I discussed the limitations of evaluation and management by telemedicine and the availability of in person appointments. The patient expressed understanding and agreed to proceed.  I connected withNAME@ on 10/27/21 at 11:30 AM EDT by a video enabled telemedicine application and verified that I am speaking with the correct person using two identifiers.  Present at visit: [name], [place]   Patient Location: Home [address] [place] [address]   Provider location:   [place]  No chief complaint on file.   HPI  Wayne Newman is a 46 y.o. male who presents via audio/video conferencing for a telehealth visit today.  He report having some increased anxiety.  Has been seeing a therapist.  Recommended that he consider trying prozac.  He is interested in adding this on to help with anxiety. He has never taken and SSRI in the past.  He is taking ADHD medication, adderall 30mg  tid.  This does possibly increase his anxiety some.    ROS:  A comprehensive ROS was completed and negative except as noted per HPI  Past Medical History:  Diagnosis Date   Thyroid disease     History reviewed. No pertinent surgical history.  Family History  Problem Relation Age of Onset   Alcohol abuse Unknown        grandfather   Heart attack Unknown        grandfather   Stroke Unknown        grandfather    Social History   Socioeconomic History   Marital status: Married    Spouse name: Not on file   Number of children: Not on file   Years of  education: Not on file   Highest education level: Not on file  Occupational History   Not on file  Tobacco Use   Smoking status: Never   Smokeless tobacco: Never  Substance and Sexual Activity   Alcohol use: Yes   Drug use: No   Sexual activity: Yes    Birth control/protection: Condom  Other Topics Concern   Not on file  Social History Narrative   Not on file   Social Determinants of Health   Financial Resource Strain: Not on file  Food Insecurity: Not on file  Transportation Needs: Not on file  Physical Activity: Not on file  Stress: Not on file  Social Connections: Not on file  Intimate Partner Violence: Not on file     Current Outpatient Medications:    amphetamine-dextroamphetamine (ADDERALL) 30 MG tablet, Take 1 tablet by mouth 3 (three) times daily., Disp: 90 tablet, Rfl: 0   FLUoxetine (PROZAC) 20 MG tablet, Take 1 tablet (20 mg total) by mouth daily., Disp: 90 tablet, Rfl: 0   levothyroxine (SYNTHROID) 300 MCG tablet, TAKE 1 TABLET BY MOUTH DAILY, BEFORE BREAKFAST, Disp: 90 tablet, Rfl: 3   meloxicam (MOBIC) 15 MG tablet, Take 15 mg by mouth daily., Disp: , Rfl:   EXAM:  VITALS per patient if applicable: Ht 6' (7.829 m)   Wt 190 lb (86.2 kg)   BMI 25.77 kg/m  GENERAL: alert, oriented, appears well and in no acute distress  HEENT: atraumatic, conjunttiva clear, no obvious abnormalities on inspection of external nose and ears  NECK: normal movements of the head and neck  LUNGS: on inspection no signs of respiratory distress, breathing rate appears normal, no obvious gross SOB, gasping or wheezing  CV: no obvious cyanosis  MS: moves all visible extremities without noticeable abnormality  PSYCH/NEURO: pleasant and cooperative, no obvious depression or anxiety, speech and thought processing grossly intact  ASSESSMENT AND PLAN:  Discussed the following assessment and plan:  Anxiety state Adding fluoxetine to help with anxiety.  Discussed that if this is  not effective I would recommend celexa.  Continue to follow with therapist regularly.  F/u in 6 weeks.      I discussed the assessment and treatment plan with the patient. The patient was provided an opportunity to ask questions and all were answered. The patient agreed with the plan and demonstrated an understanding of the instructions.   The patient was advised to call back or seek an in-person evaluation if the symptoms worsen or if the condition fails to improve as anticipated.    [name], DO

## 2021-10-27 NOTE — Assessment & Plan Note (Signed)
Adding fluoxetine to help with anxiety.  Discussed that if this is not effective I would recommend celexa.  Continue to follow with therapist regularly.  F/u in 6 weeks.

## 2021-11-13 DIAGNOSIS — F4323 Adjustment disorder with mixed anxiety and depressed mood: Secondary | ICD-10-CM | POA: Diagnosis not present

## 2021-11-18 ENCOUNTER — Other Ambulatory Visit: Payer: Self-pay | Admitting: Family Medicine

## 2021-11-18 DIAGNOSIS — F988 Other specified behavioral and emotional disorders with onset usually occurring in childhood and adolescence: Secondary | ICD-10-CM

## 2021-11-18 MED ORDER — AMPHETAMINE-DEXTROAMPHETAMINE 30 MG PO TABS
30.0000 mg | ORAL_TABLET | Freq: Three times a day (TID) | ORAL | 0 refills | Status: DC
Start: 2021-11-18 — End: 2021-12-24

## 2021-11-26 ENCOUNTER — Encounter: Payer: Self-pay | Admitting: Family Medicine

## 2021-11-26 DIAGNOSIS — F988 Other specified behavioral and emotional disorders with onset usually occurring in childhood and adolescence: Secondary | ICD-10-CM

## 2021-11-26 DIAGNOSIS — E039 Hypothyroidism, unspecified: Secondary | ICD-10-CM

## 2021-11-26 MED ORDER — LEVOTHYROXINE SODIUM 300 MCG PO TABS: 300.0000 ug | ORAL_TABLET | Freq: Every day | ORAL | 0 refills | Status: DC

## 2021-11-26 NOTE — Addendum Note (Signed)
Addended by: Elizabeth Palau on: 11/26/2021 01:17 PM   Modules accepted: Orders

## 2021-11-27 DIAGNOSIS — F4323 Adjustment disorder with mixed anxiety and depressed mood: Secondary | ICD-10-CM | POA: Diagnosis not present

## 2021-12-11 DIAGNOSIS — F4323 Adjustment disorder with mixed anxiety and depressed mood: Secondary | ICD-10-CM | POA: Diagnosis not present

## 2021-12-18 DIAGNOSIS — F429 Obsessive-compulsive disorder, unspecified: Secondary | ICD-10-CM | POA: Diagnosis not present

## 2021-12-24 ENCOUNTER — Other Ambulatory Visit: Payer: Self-pay | Admitting: Family Medicine

## 2021-12-24 DIAGNOSIS — F988 Other specified behavioral and emotional disorders with onset usually occurring in childhood and adolescence: Secondary | ICD-10-CM

## 2021-12-24 MED ORDER — AMPHETAMINE-DEXTROAMPHETAMINE 30 MG PO TABS: 30.0000 mg | ORAL_TABLET | Freq: Three times a day (TID) | ORAL | 0 refills | Status: DC

## 2021-12-24 NOTE — Telephone Encounter (Signed)
LOV: 10/27/21 (Vv)

## 2021-12-24 NOTE — Telephone Encounter (Signed)
Called patient and left voicemail for patient to call office to schedule 6 month follow up, thanks.

## 2021-12-24 NOTE — Telephone Encounter (Signed)
Front desk: Please contact the patient to schedule 6 month follow-up due in January 2024. Thanks

## 2022-01-01 ENCOUNTER — Other Ambulatory Visit: Payer: Self-pay | Admitting: Family Medicine

## 2022-01-01 DIAGNOSIS — F4322 Adjustment disorder with anxiety: Secondary | ICD-10-CM | POA: Diagnosis not present

## 2022-01-22 DIAGNOSIS — F4322 Adjustment disorder with anxiety: Secondary | ICD-10-CM | POA: Diagnosis not present

## 2022-01-27 ENCOUNTER — Encounter: Payer: Self-pay | Admitting: Family Medicine

## 2022-01-27 ENCOUNTER — Ambulatory Visit: Payer: BC Managed Care – PPO | Admitting: Family Medicine

## 2022-01-27 VITALS — BP 115/75 | HR 100 | Ht 72.0 in | Wt 200.0 lb

## 2022-01-27 DIAGNOSIS — F988 Other specified behavioral and emotional disorders with onset usually occurring in childhood and adolescence: Secondary | ICD-10-CM | POA: Diagnosis not present

## 2022-01-27 DIAGNOSIS — F411 Generalized anxiety disorder: Secondary | ICD-10-CM

## 2022-01-27 DIAGNOSIS — R7989 Other specified abnormal findings of blood chemistry: Secondary | ICD-10-CM

## 2022-01-27 DIAGNOSIS — Z113 Encounter for screening for infections with a predominantly sexual mode of transmission: Secondary | ICD-10-CM

## 2022-01-27 DIAGNOSIS — E038 Other specified hypothyroidism: Secondary | ICD-10-CM | POA: Diagnosis not present

## 2022-01-27 DIAGNOSIS — R6882 Decreased libido: Secondary | ICD-10-CM | POA: Diagnosis not present

## 2022-01-27 DIAGNOSIS — Z23 Encounter for immunization: Secondary | ICD-10-CM | POA: Diagnosis not present

## 2022-01-27 MED ORDER — VORTIOXETINE HBR 10 MG PO TABS: 10.0000 mg | ORAL_TABLET | Freq: Every day | ORAL | 3 refills | Status: DC

## 2022-01-27 MED ORDER — TADALAFIL 20 MG PO TABS: 10.0000 mg | ORAL_TABLET | ORAL | 11 refills | Status: DC | PRN

## 2022-01-27 NOTE — Progress Notes (Signed)
Wayne Newman - 47 y.o. male MRN [id number]  Date of birth: [birth year]  Subjective Chief Complaint  Patient presents with   Medication Problem    HPI Wayne Newman is a 47 year old male here today for follow-up visit.  Remains on levothyroxine for management of hypothyroidism.  He feels like current strength is working pretty well for him however he has noticed some decreased libido.  He is unsure if this is related to his thyroid, low testosterone levels or medication for anxiety.    Anxiety is currently managed with fluoxetine.  He feels like this works pretty well for his anxiety but as above he has has concerns about this affecting his libido.  Doing well with current strength of Adderall.  He does need a renewal of this.  He would like to have STI screening.  Denies symptoms at this time.  ROS:  A comprehensive ROS was completed and negative except as noted per HPI  Allergies  Allergen Reactions   Elemental Sulfur Rash and Other (See Comments)    Had a sulfur abx for pink eye and made eyes more irritated and red    Sulfa Antibiotics Other (See Comments)    Given for pink eye & irritated eye more.    Past Medical History:  Diagnosis Date   Thyroid disease     History reviewed. No pertinent surgical history.  Social History   Socioeconomic History   Marital status: Married    Spouse name: Not on file   Number of children: Not on file   Years of education: Not on file   Highest education level: Not on file  Occupational History   Not on file  Tobacco Use   Smoking status: Never   Smokeless tobacco: Never  Substance and Sexual Activity   Alcohol use: Yes   Drug use: No   Sexual activity: Yes    Birth control/protection: Condom  Other Topics Concern   Not on file  Social History Narrative   Not on file   Social Determinants of Health   Financial Resource Strain: Not on file  Food Insecurity: Not on file  Transportation Needs: Not on file  Physical Activity: Not  on file  Stress: Not on file  Social Connections: Not on file    Family History  Problem Relation Age of Onset   Alcohol abuse Unknown        grandfather   Heart attack Unknown        grandfather   Stroke Unknown        grandfather    Health Maintenance  Topic Date Due   COVID-19 Vaccine (4 - 2023-24 season) 02/11/2022 (Originally 09/11/2021)   Hepatitis C Screening  07/17/2022 (Originally 08/31/1994)   HIV Screening  07/17/2022 (Originally 08/31/1991)   COLONOSCOPY (Pts 45-61yrs Insurance coverage will need to be confirmed)  10/28/2022 (Originally 08/30/2021)   DTaP/Tdap/Td (4 - Td or Tdap) 08/28/2026   INFLUENZA VACCINE  Completed   HPV VACCINES  Aged Out     ----------------------------------------------------------------------------------------------------------------------------------------------------------------------------------------------------------------- Physical Exam BP 115/75 (BP Location: Left Arm, Patient Position: Sitting, Cuff Size: Large)   Pulse 100   Ht 6' (1.829 m)   Wt 200 lb (90.7 kg)   SpO2 97%   BMI 27.12 kg/m   Physical Exam Constitutional:      Appearance: Normal appearance.  HENT:     Head: Normocephalic and atraumatic.  Eyes:     General: No scleral icterus. Cardiovascular:     Rate and Rhythm: Normal rate and  regular rhythm.  Pulmonary:     Effort: Pulmonary effort is normal.     Breath sounds: Normal breath sounds.  Musculoskeletal:     Cervical back: Neck supple.  Neurological:     Mental Status: He is alert.  Psychiatric:        Mood and Affect: Mood normal.        Behavior: Behavior normal.     ------------------------------------------------------------------------------------------------------------------------------------------------------------------------------------------------------------------- Assessment and Plan  Hypothyroid Updating TSH today.  ADD (attention deficit disorder) Doing well with current strength  of Adderall.  Will plan to continue this  Routine screening for STI (sexually transmitted infection) Screening labs per orders..  Denies any symptoms at this time.  Decreased libido  Possible related to fluoxetine use.  We discussed trying change to Trintellix.  Checking testosterone levels.  Anxiety state Will try to get Trintellix approved for him.  He will continue fluoxetine for now.  Will add tadalafil as needed for sexual side effects.  Side effects of this reviewed with him.   Meds ordered this encounter  Medications   vortioxetine HBr (TRINTELLIX) 10 MG TABS tablet    Sig: Take 1 tablet (10 mg total) by mouth daily.    Dispense:  30 tablet    Refill:  3   tadalafil (CIALIS) 20 MG tablet    Sig: Take 0.5-1 tablets (10-20 mg total) by mouth every other day as needed for erectile dysfunction.    Dispense:  10 tablet    Refill:  11    No follow-ups on file.    This visit occurred during the SARS-CoV-2 public health emergency.  Safety protocols were in place, including screening questions prior to the visit, additional usage of staff PPE, and extensive cleaning of exam room while observing appropriate contact time as indicated for disinfecting solutions.

## 2022-01-27 NOTE — Assessment & Plan Note (Signed)
Doing well with current strength of Adderall.  Will plan to continue this

## 2022-01-27 NOTE — Assessment & Plan Note (Signed)
Will try to get Trintellix approved for him.  He will continue fluoxetine for now.  Will add tadalafil as needed for sexual side effects.  Side effects of this reviewed with him.

## 2022-01-27 NOTE — Assessment & Plan Note (Signed)
Updating TSH today. 

## 2022-01-27 NOTE — Assessment & Plan Note (Signed)
Screening labs per orders..  Denies any symptoms at this time.

## 2022-01-27 NOTE — Assessment & Plan Note (Signed)
Possible related to fluoxetine use.  We discussed trying change to Trintellix.  Checking testosterone levels.

## 2022-01-27 NOTE — Patient Instructions (Signed)
Let's see if we can get trintellix approved.  If this is not approved lets do bupropion Continue the fluoxetine for now until you hear back on the trintellix.  You may use tadalafil (generic cialis) as needed.  We'll be in touch with lab results.

## 2022-01-29 ENCOUNTER — Other Ambulatory Visit: Payer: Self-pay | Admitting: Family Medicine

## 2022-01-29 DIAGNOSIS — E038 Other specified hypothyroidism: Secondary | ICD-10-CM

## 2022-01-29 DIAGNOSIS — F988 Other specified behavioral and emotional disorders with onset usually occurring in childhood and adolescence: Secondary | ICD-10-CM

## 2022-01-29 MED ORDER — LEVOTHYROXINE SODIUM 200 MCG PO TABS: 200.0000 ug | ORAL_TABLET | Freq: Every day | ORAL | 0 refills | Status: DC

## 2022-01-30 LAB — COMPLETE METABOLIC PANEL WITH GFR
AG Ratio: 1.8 (calc) (ref 1.0–2.5)
ALT: 59 U/L — ABNORMAL HIGH (ref 9–46)
AST: 41 U/L — ABNORMAL HIGH (ref 10–40)
Albumin: 4.6 g/dL (ref 3.6–5.1)
Alkaline phosphatase (APISO): 59 U/L (ref 36–130)
BUN: 20 mg/dL (ref 7–25)
CO2: 25 mmol/L (ref 20–32)
Calcium: 9.7 mg/dL (ref 8.6–10.3)
Chloride: 104 mmol/L (ref 98–110)
Creat: 0.9 mg/dL (ref 0.60–1.29)
Globulin: 2.6 g/dL (calc) (ref 1.9–3.7)
Glucose, Bld: 85 mg/dL (ref 65–99)
Potassium: 4.2 mmol/L (ref 3.5–5.3)
Sodium: 140 mmol/L (ref 135–146)
Total Bilirubin: 0.8 mg/dL (ref 0.2–1.2)
Total Protein: 7.2 g/dL (ref 6.1–8.1)
eGFR: 107 mL/min/{1.73_m2} (ref >=60)

## 2022-01-30 LAB — TSH: TSH: 0.02 mIU/L — ABNORMAL LOW (ref 0.40–4.50)

## 2022-01-30 LAB — CBC WITH DIFFERENTIAL/PLATELET
Absolute Monocytes: 727 cells/uL (ref 200–950)
Basophils Absolute: 58 cells/uL (ref 0–200)
Basophils Relative: 0.8 %
Eosinophils Absolute: 202 cells/uL (ref 15–500)
Eosinophils Relative: 2.8 %
HCT: 47.9 % (ref 38.5–50.0)
Hemoglobin: 16.5 g/dL (ref 13.2–17.1)
Lymphs Abs: 1886 cells/uL (ref 850–3900)
MCH: 31.5 pg (ref 27.0–33.0)
MCHC: 34.4 g/dL (ref 32.0–36.0)
MCV: 91.6 fL (ref 80.0–100.0)
MPV: 10.4 fL (ref 7.5–12.5)
Monocytes Relative: 10.1 %
Neutro Abs: 4327 cells/uL (ref 1500–7800)
Neutrophils Relative %: 60.1 %
Platelets: 229 10*3/uL (ref 140–400)
RBC: 5.23 10*6/uL (ref 4.20–5.80)
RDW: 12.4 % (ref 11.0–15.0)
Total Lymphocyte: 26.2 %
WBC: 7.2 10*3/uL (ref 3.8–10.8)

## 2022-01-30 LAB — HIV ANTIBODY (ROUTINE TESTING W REFLEX): HIV 1&2 Ab, 4th Generation: NONREACTIVE

## 2022-01-30 LAB — TESTOSTERONE: Testosterone: 685 ng/dL (ref 250–827)

## 2022-01-30 LAB — RPR: RPR Ser Ql: NONREACTIVE

## 2022-01-30 LAB — CHLAMYDIA/NEISSERIA GONORRHOEAE RNA,TMA,UROGENTIAL
C. trachomatis RNA, TMA: NOT DETECTED
N. gonorrhoeae RNA, TMA: NOT DETECTED

## 2022-01-30 LAB — HEPATITIS C ANTIBODY: Hepatitis C Ab: NONREACTIVE

## 2022-02-01 ENCOUNTER — Encounter: Payer: Self-pay | Admitting: Family Medicine

## 2022-02-05 DIAGNOSIS — F4322 Adjustment disorder with anxiety: Secondary | ICD-10-CM | POA: Diagnosis not present

## 2022-02-12 ENCOUNTER — Other Ambulatory Visit: Payer: Self-pay | Admitting: Family Medicine

## 2022-02-15 MED ORDER — AMPHETAMINE-DEXTROAMPHETAMINE 30 MG PO TABS: 30.0000 mg | ORAL_TABLET | Freq: Three times a day (TID) | ORAL | 0 refills | Status: DC

## 2022-02-26 DIAGNOSIS — F4322 Adjustment disorder with anxiety: Secondary | ICD-10-CM | POA: Diagnosis not present

## 2022-03-12 DIAGNOSIS — F4322 Adjustment disorder with anxiety: Secondary | ICD-10-CM | POA: Diagnosis not present

## 2022-03-26 DIAGNOSIS — F4322 Adjustment disorder with anxiety: Secondary | ICD-10-CM | POA: Diagnosis not present

## 2022-04-01 DIAGNOSIS — F4322 Adjustment disorder with anxiety: Secondary | ICD-10-CM | POA: Diagnosis not present

## 2022-04-02 ENCOUNTER — Other Ambulatory Visit: Payer: Self-pay | Admitting: Family Medicine

## 2022-04-06 MED ORDER — AMPHETAMINE-DEXTROAMPHETAMINE 30 MG PO TABS: 30.0000 mg | ORAL_TABLET | Freq: Three times a day (TID) | ORAL | 0 refills | Status: DC

## 2022-04-16 DIAGNOSIS — F4322 Adjustment disorder with anxiety: Secondary | ICD-10-CM | POA: Diagnosis not present

## 2022-05-07 DIAGNOSIS — F4322 Adjustment disorder with anxiety: Secondary | ICD-10-CM | POA: Diagnosis not present

## 2022-05-17 ENCOUNTER — Other Ambulatory Visit: Payer: Self-pay | Admitting: Family Medicine

## 2022-05-17 DIAGNOSIS — E038 Other specified hypothyroidism: Secondary | ICD-10-CM

## 2022-05-18 ENCOUNTER — Other Ambulatory Visit: Payer: Self-pay | Admitting: Family Medicine

## 2022-05-18 DIAGNOSIS — E038 Other specified hypothyroidism: Secondary | ICD-10-CM

## 2022-05-19 MED ORDER — AMPHETAMINE-DEXTROAMPHETAMINE 30 MG PO TABS: 30.0000 mg | ORAL_TABLET | Freq: Three times a day (TID) | ORAL | 0 refills | Status: DC

## 2022-05-19 MED ORDER — LEVOTHYROXINE SODIUM 200 MCG PO TABS: 200.0000 ug | ORAL_TABLET | Freq: Every day | ORAL | 0 refills | Status: DC

## 2022-05-21 DIAGNOSIS — F4322 Adjustment disorder with anxiety: Secondary | ICD-10-CM | POA: Diagnosis not present

## 2022-06-04 DIAGNOSIS — F4323 Adjustment disorder with mixed anxiety and depressed mood: Secondary | ICD-10-CM | POA: Diagnosis not present

## 2022-06-18 DIAGNOSIS — F4322 Adjustment disorder with anxiety: Secondary | ICD-10-CM | POA: Diagnosis not present

## 2022-07-02 DIAGNOSIS — F4322 Adjustment disorder with anxiety: Secondary | ICD-10-CM | POA: Diagnosis not present

## 2022-07-13 ENCOUNTER — Other Ambulatory Visit: Payer: Self-pay | Admitting: Family Medicine

## 2022-07-13 DIAGNOSIS — E038 Other specified hypothyroidism: Secondary | ICD-10-CM

## 2022-07-13 MED ORDER — LEVOTHYROXINE SODIUM 200 MCG PO TABS
200.0000 ug | ORAL_TABLET | Freq: Every day | ORAL | 0 refills | Status: DC
Start: 2022-07-13 — End: 2023-09-29

## 2022-07-13 NOTE — Telephone Encounter (Signed)
Looks like he is due for follow-up but has no appointment scheduled.

## 2022-07-16 NOTE — Telephone Encounter (Signed)
Patient scheudled for 07/26/22,

## 2022-07-16 NOTE — Telephone Encounter (Signed)
Per provider, patient is due for a follow up on Levothyroxine rx. Please contact the patient to schedule an appt with the provider. Thanks in advance.

## 2022-07-26 ENCOUNTER — Encounter: Payer: Self-pay | Admitting: Family Medicine

## 2022-07-26 ENCOUNTER — Ambulatory Visit: Payer: BC Managed Care – PPO | Admitting: Family Medicine

## 2022-07-26 VITALS — BP 128/84 | HR 88 | Ht 72.0 in | Wt 207.0 lb

## 2022-07-26 DIAGNOSIS — R5383 Other fatigue: Secondary | ICD-10-CM | POA: Diagnosis not present

## 2022-07-26 DIAGNOSIS — E038 Other specified hypothyroidism: Secondary | ICD-10-CM | POA: Diagnosis not present

## 2022-07-26 DIAGNOSIS — F411 Generalized anxiety disorder: Secondary | ICD-10-CM | POA: Diagnosis not present

## 2022-07-26 DIAGNOSIS — Z79899 Other long term (current) drug therapy: Secondary | ICD-10-CM | POA: Diagnosis not present

## 2022-07-26 DIAGNOSIS — F988 Other specified behavioral and emotional disorders with onset usually occurring in childhood and adolescence: Secondary | ICD-10-CM | POA: Diagnosis not present

## 2022-07-27 ENCOUNTER — Encounter: Payer: Self-pay | Admitting: Family Medicine

## 2022-07-30 ENCOUNTER — Other Ambulatory Visit: Payer: Self-pay | Admitting: Family Medicine

## 2022-07-30 DIAGNOSIS — F4322 Adjustment disorder with anxiety: Secondary | ICD-10-CM | POA: Diagnosis not present

## 2022-07-30 LAB — TSH: TSH: 0.51 mIU/L (ref 0.40–4.50)

## 2022-07-30 LAB — B12 AND FOLATE PANEL
Folate: 10 ng/mL
Vitamin B-12: 414 pg/mL (ref 200–1100)

## 2022-07-30 LAB — T4, FREE: Free T4: 1.9 ng/dL — ABNORMAL HIGH (ref 0.8–1.8)

## 2022-08-01 NOTE — Assessment & Plan Note (Signed)
Trintellix is effective for him.  Will continue this at current strength

## 2022-08-01 NOTE — Assessment & Plan Note (Signed)
He has not had repeat TSH since adjustment to Synthroid in January.  Updated TSH ordered.

## 2022-08-01 NOTE — Progress Notes (Signed)
Wayne Newman - 47 y.o. male MRN [id number]  Date of birth: 21-Jul-1975  Subjective Chief Complaint  Patient presents with   Fatigue    HPI Wayne Newman is a 47 year old male here today for follow-up visit.  He has history of ADHD as well as congenital hypothyroidism.  He continues on Adderall 30 mg up to 3 times daily.  This continues to work well for him at current strength.  He denies side effects at this time.  Trintellix remains effective for his mood.  He is aware that he has had some increased fatigue.  He thinks that this could be related to his thyroid but is not sure.  He did quite a few labs and January to evaluate his fatigue as well.  Levothyroxine was adjusted due to suppression his TSH.  He denies any real noticeable difference with this.  ROS:  A comprehensive ROS was completed and negative except as noted per HPI    Allergies  Allergen Reactions   Elemental Sulfur Rash and Other (See Comments)    Had a sulfur abx for pink eye and made eyes more irritated and red    Sulfa Antibiotics Other (See Comments)    Given for pink eye & irritated eye more.    Past Medical History:  Diagnosis Date   Thyroid disease     History reviewed. No pertinent surgical history.  Social History   Socioeconomic History   Marital status: Married    Spouse name: Not on file   Number of children: Not on file   Years of education: Not on file   Highest education level: Not on file  Occupational History   Not on file  Tobacco Use   Smoking status: Never   Smokeless tobacco: Never  Substance and Sexual Activity   Alcohol use: Yes   Drug use: No   Sexual activity: Yes    Birth control/protection: Condom  Other Topics Concern   Not on file  Social History Narrative   Not on file   Social Determinants of Health   Financial Resource Strain: Not on file  Food Insecurity: Not on file  Transportation Needs: Not on file  Physical Activity: Not on file  Stress: Not on file  Social  Connections: Not on file    Family History  Problem Relation Age of Onset   Alcohol abuse Unknown        grandfather   Heart attack Unknown        grandfather   Stroke Unknown        grandfather    Health Maintenance  Topic Date Due   COVID-19 Vaccine (4 - 2023-24 season) 10/26/2022 (Originally 09/11/2021)   Colonoscopy  10/28/2022 (Originally 08/30/2021)   INFLUENZA VACCINE  08/12/2022   DTaP/Tdap/Td (4 - Td or Tdap) 08/28/2026   Hepatitis C Screening  Completed   HIV Screening  Completed   HPV VACCINES  Aged Out     ----------------------------------------------------------------------------------------------------------------------------------------------------------------------------------------------------------------- Physical Exam BP 128/84 (BP Location: Left Arm, Patient Position: Sitting, Cuff Size: Large)   Pulse 88   Ht 6' (1.829 m)   Wt 207 lb (93.9 kg)   SpO2 98%   BMI 28.07 kg/m   Physical Exam Constitutional:      Appearance: Normal appearance.  HENT:     Head: Normocephalic and atraumatic.  Musculoskeletal:     Cervical back: Neck supple.  Neurological:     Mental Status: He is alert.  Psychiatric:        Mood and  Affect: Mood normal.        Behavior: Behavior normal.     ------------------------------------------------------------------------------------------------------------------------------------------------------------------------------------------------------------------- Assessment and Plan  Hypothyroid He has not had repeat TSH since adjustment to Synthroid in January.  Updated TSH ordered.  Fatigue Continues to have longstanding fatigue.  Checking B12 and folate levels  Anxiety state Trintellix is effective for him.  Will continue this at current strength  ADD (attention deficit disorder) He is doing well with Adderall at current strength.  Will plan to continue with follow-up in 6 months.   No orders of the defined types were  placed in this encounter.   Return in about 6 months (around 01/26/2023) for ADD.    This visit occurred during the SARS-CoV-2 public health emergency.  Safety protocols were in place, including screening questions prior to the visit, additional usage of staff PPE, and extensive cleaning of exam room while observing appropriate contact time as indicated for disinfecting solutions.

## 2022-08-01 NOTE — Assessment & Plan Note (Signed)
He is doing well with Adderall at current strength.  Will plan to continue with follow-up in 6 months.

## 2022-08-01 NOTE — Assessment & Plan Note (Signed)
Continues to have longstanding fatigue.  Checking B12 and folate levels

## 2022-08-03 MED ORDER — AMPHETAMINE-DEXTROAMPHETAMINE 30 MG PO TABS: 30.0000 mg | ORAL_TABLET | Freq: Three times a day (TID) | ORAL | 0 refills | Status: DC

## 2022-08-15 ENCOUNTER — Other Ambulatory Visit: Payer: Self-pay | Admitting: Family Medicine

## 2022-08-25 DIAGNOSIS — F4322 Adjustment disorder with anxiety: Secondary | ICD-10-CM | POA: Diagnosis not present

## 2022-09-09 DIAGNOSIS — F4322 Adjustment disorder with anxiety: Secondary | ICD-10-CM | POA: Diagnosis not present

## 2022-09-17 ENCOUNTER — Other Ambulatory Visit: Payer: Self-pay | Admitting: Family Medicine

## 2022-09-21 ENCOUNTER — Other Ambulatory Visit: Payer: Self-pay | Admitting: Family Medicine

## 2022-09-21 DIAGNOSIS — F988 Other specified behavioral and emotional disorders with onset usually occurring in childhood and adolescence: Secondary | ICD-10-CM

## 2022-09-22 ENCOUNTER — Encounter: Payer: Self-pay | Admitting: Family Medicine

## 2022-09-22 MED ORDER — AMPHETAMINE-DEXTROAMPHETAMINE 30 MG PO TABS
30.0000 mg | ORAL_TABLET | Freq: Three times a day (TID) | ORAL | 0 refills | Status: DC
Start: 2022-09-22 — End: 2022-11-01

## 2022-09-22 NOTE — Telephone Encounter (Signed)
Requesting rx rf of  Adderall 30mg   Last written 08/03/2022 Last OV 07/26/2022 Next schld appt 01/27/2023

## 2022-09-24 DIAGNOSIS — F4322 Adjustment disorder with anxiety: Secondary | ICD-10-CM | POA: Diagnosis not present

## 2022-10-08 DIAGNOSIS — F4322 Adjustment disorder with anxiety: Secondary | ICD-10-CM | POA: Diagnosis not present

## 2022-10-22 DIAGNOSIS — F4322 Adjustment disorder with anxiety: Secondary | ICD-10-CM | POA: Diagnosis not present

## 2022-11-01 ENCOUNTER — Other Ambulatory Visit: Payer: Self-pay | Admitting: Family Medicine

## 2022-11-01 DIAGNOSIS — F909 Attention-deficit hyperactivity disorder, unspecified type: Secondary | ICD-10-CM

## 2022-11-01 DIAGNOSIS — F988 Other specified behavioral and emotional disorders with onset usually occurring in childhood and adolescence: Secondary | ICD-10-CM

## 2022-11-02 MED ORDER — AMPHETAMINE-DEXTROAMPHETAMINE 30 MG PO TABS
30.0000 mg | ORAL_TABLET | Freq: Three times a day (TID) | ORAL | 0 refills | Status: DC
Start: 2022-11-02 — End: 2022-12-15

## 2022-11-02 NOTE — Telephone Encounter (Signed)
Requesting rx rf of Adderall 30mg   Last written 09/22/2022 Last OV 07/26/2022 Upcoming appt 01/27/2023

## 2022-11-05 DIAGNOSIS — F4322 Adjustment disorder with anxiety: Secondary | ICD-10-CM | POA: Diagnosis not present

## 2022-11-19 ENCOUNTER — Other Ambulatory Visit: Payer: Self-pay | Admitting: Family Medicine

## 2022-11-19 DIAGNOSIS — E038 Other specified hypothyroidism: Secondary | ICD-10-CM

## 2022-11-19 DIAGNOSIS — F4322 Adjustment disorder with anxiety: Secondary | ICD-10-CM | POA: Diagnosis not present

## 2022-12-03 DIAGNOSIS — F4322 Adjustment disorder with anxiety: Secondary | ICD-10-CM | POA: Diagnosis not present

## 2022-12-15 ENCOUNTER — Other Ambulatory Visit: Payer: Self-pay | Admitting: Family Medicine

## 2022-12-15 DIAGNOSIS — F909 Attention-deficit hyperactivity disorder, unspecified type: Secondary | ICD-10-CM

## 2022-12-16 MED ORDER — AMPHETAMINE-DEXTROAMPHETAMINE 30 MG PO TABS: 30.0000 mg | ORAL_TABLET | Freq: Three times a day (TID) | ORAL | 0 refills | Status: DC

## 2022-12-17 DIAGNOSIS — F4322 Adjustment disorder with anxiety: Secondary | ICD-10-CM | POA: Diagnosis not present

## 2022-12-24 ENCOUNTER — Encounter: Payer: Self-pay | Admitting: Family Medicine

## 2022-12-24 MED ORDER — FLUOXETINE HCL 20 MG PO CAPS: 20.0000 mg | ORAL_CAPSULE | Freq: Every day | ORAL | 0 refills | Status: DC

## 2022-12-31 DIAGNOSIS — F4322 Adjustment disorder with anxiety: Secondary | ICD-10-CM | POA: Diagnosis not present

## 2023-01-14 DIAGNOSIS — F4322 Adjustment disorder with anxiety: Secondary | ICD-10-CM | POA: Diagnosis not present

## 2023-01-27 ENCOUNTER — Ambulatory Visit: Payer: BC Managed Care – PPO | Admitting: Family Medicine

## 2023-01-27 ENCOUNTER — Encounter: Payer: Self-pay | Admitting: Family Medicine

## 2023-01-27 VITALS — BP 115/74 | HR 94 | Ht 72.0 in | Wt 212.0 lb

## 2023-01-27 DIAGNOSIS — F909 Attention-deficit hyperactivity disorder, unspecified type: Secondary | ICD-10-CM

## 2023-01-27 DIAGNOSIS — R946 Abnormal results of thyroid function studies: Secondary | ICD-10-CM | POA: Diagnosis not present

## 2023-01-27 DIAGNOSIS — R5383 Other fatigue: Secondary | ICD-10-CM

## 2023-01-27 DIAGNOSIS — Z23 Encounter for immunization: Secondary | ICD-10-CM | POA: Diagnosis not present

## 2023-01-27 DIAGNOSIS — E038 Other specified hypothyroidism: Secondary | ICD-10-CM | POA: Diagnosis not present

## 2023-01-27 MED ORDER — AMPHETAMINE-DEXTROAMPHETAMINE 30 MG PO TABS
30.0000 mg | ORAL_TABLET | Freq: Three times a day (TID) | ORAL | 0 refills | Status: DC
Start: 2023-01-27 — End: 2023-03-17

## 2023-01-27 MED ORDER — FLUOXETINE HCL 20 MG PO CAPS: 20.0000 mg | ORAL_CAPSULE | Freq: Every day | ORAL | 0 refills | Status: DC

## 2023-01-27 MED ORDER — AMPHETAMINE-DEXTROAMPHETAMINE 30 MG PO TABS: 30.0000 mg | ORAL_TABLET | Freq: Three times a day (TID) | ORAL | 0 refills | Status: DC

## 2023-01-27 NOTE — Assessment & Plan Note (Signed)
Update TSH and free T4. °

## 2023-01-27 NOTE — Assessment & Plan Note (Signed)
Continued fatigue.  Checking ferritin, update TSH and check cortisol levels.

## 2023-01-27 NOTE — Assessment & Plan Note (Signed)
He is doing well with Adderall at current strength.  Will plan to continue with follow-up in 6 months.

## 2023-01-27 NOTE — Progress Notes (Signed)
Wayne Newman - 48 y.o. male MRN [id number]  Date of birth: [birth year]  Subjective Chief Complaint  Patient presents with   Weight Gain   Fatigue    HPI Wayne Newman is a 48 y.o. male here today for follow up visit.   Reports continuation of low energy.  He is concerned about weight gain as well.  His weight last visit was 207 and today is 212.  He feels that his diet is pretty good.  His TSH has been slightly suppressed in the past and T4 levels were slightly elevated recently.  Continues on adderall 30mg  TID which he feels is working pretty well for ADD symptoms.  No side effects with adderall at current strength.   Mood and anxiety are stable with trintellix daily and fluoxetine every other day.   ROS:  A comprehensive ROS was completed and negative except as noted per HPI    Allergies  Allergen Reactions   Elemental Sulfur Rash and Other (See Comments)    Had a sulfur abx for pink eye and made eyes more irritated and red    Sulfa Antibiotics Other (See Comments)    Given for pink eye & irritated eye more.    Past Medical History:  Diagnosis Date   Thyroid disease     History reviewed. No pertinent surgical history.  Social History   Socioeconomic History   Marital status: Married    Spouse name: Not on file   Number of children: Not on file   Years of education: Not on file   Highest education level: Not on file  Occupational History   Not on file  Tobacco Use   Smoking status: Never   Smokeless tobacco: Never  Substance and Sexual Activity   Alcohol use: Yes   Drug use: No   Sexual activity: Yes    Birth control/protection: Condom  Other Topics Concern   Not on file  Social History Narrative   Not on file   Social Drivers of Health   Financial Resource Strain: Not on file  Food Insecurity: Not on file  Transportation Needs: Not on file  Physical Activity: Not on file  Stress: Not on file  Social Connections: Not on file    Family History   Problem Relation Age of Onset   Alcohol abuse Unknown        grandfather   Heart attack Unknown        grandfather   Stroke Unknown        grandfather    Health Maintenance  Topic Date Due   Colonoscopy  Never done   INFLUENZA VACCINE  08/12/2022   COVID-19 Vaccine (4 - 2024-25 season) 09/12/2022   DTaP/Tdap/Td (4 - Td or Tdap) 08/28/2026   Hepatitis C Screening  Completed   HIV Screening  Completed   HPV VACCINES  Aged Out     ----------------------------------------------------------------------------------------------------------------------------------------------------------------------------------------------------------------- Physical Exam BP 115/74 (BP Location: Left Arm, Patient Position: Sitting, Cuff Size: Large)   Pulse 94   Ht 6' (1.829 m)   Wt 212 lb (96.2 kg)   SpO2 98%   BMI 28.75 kg/m   Physical Exam Constitutional:      Appearance: Normal appearance.  HENT:     Head: Normocephalic and atraumatic.  Cardiovascular:     Rate and Rhythm: Normal rate and regular rhythm.  Pulmonary:     Effort: Pulmonary effort is normal.     Breath sounds: Normal breath sounds.  Neurological:     Mental Status:  He is alert.     ------------------------------------------------------------------------------------------------------------------------------------------------------------------------------------------------------------------- Assessment and Plan  Fatigue Continued fatigue.  Checking ferritin, update TSH and check cortisol levels.   ADD (attention deficit disorder) He is doing well with Adderall at current strength.  Will plan to continue with follow-up in 6 months.  Hypothyroid Update TSH and free T4   Meds ordered this encounter  Medications   FLUoxetine (PROZAC) 20 MG capsule    Sig: Take 1 capsule (20 mg total) by mouth daily.    Dispense:  90 capsule    Refill:  0   amphetamine-dextroamphetamine (ADDERALL) 30 MG tablet    Sig: Take 1  tablet by mouth 3 (three) times daily.    Dispense:  90 tablet    Refill:  0   amphetamine-dextroamphetamine (ADDERALL) 30 MG tablet    Sig: Take 1 tablet by mouth 3 (three) times daily.    Dispense:  90 tablet    Refill:  0    No follow-ups on file.    This visit occurred during the SARS-CoV-2 public health emergency.  Safety protocols were in place, including screening questions prior to the visit, additional usage of staff PPE, and extensive cleaning of exam room while observing appropriate contact time as indicated for disinfecting solutions.

## 2023-01-28 DIAGNOSIS — F4322 Adjustment disorder with anxiety: Secondary | ICD-10-CM | POA: Diagnosis not present

## 2023-01-28 LAB — CORTISOL: Cortisol: 5.2 ug/dL — ABNORMAL LOW (ref 6.2–19.4)

## 2023-01-28 LAB — IRON,TIBC AND FERRITIN PANEL
Ferritin: 223 ng/mL (ref 30–400)
Iron Saturation: 27 % (ref 15–55)
Iron: 76 ug/dL (ref 38–169)
Total Iron Binding Capacity: 281 ug/dL (ref 250–450)
UIBC: 205 ug/dL (ref 111–343)

## 2023-01-28 LAB — TSH+FREE T4
Free T4: 1.44 ng/dL (ref 0.82–1.77)
TSH: 5.34 u[IU]/mL — ABNORMAL HIGH (ref 0.450–4.500)

## 2023-02-04 ENCOUNTER — Encounter: Payer: Self-pay | Admitting: Family Medicine

## 2023-02-25 DIAGNOSIS — F4322 Adjustment disorder with anxiety: Secondary | ICD-10-CM | POA: Diagnosis not present

## 2023-03-11 DIAGNOSIS — F4322 Adjustment disorder with anxiety: Secondary | ICD-10-CM | POA: Diagnosis not present

## 2023-03-17 ENCOUNTER — Other Ambulatory Visit: Payer: Self-pay | Admitting: Family Medicine

## 2023-03-17 DIAGNOSIS — F909 Attention-deficit hyperactivity disorder, unspecified type: Secondary | ICD-10-CM

## 2023-03-18 NOTE — Telephone Encounter (Signed)
 Last OV 01/27/2023  Last filled 01/27/2023  Upcoming appointment 07/27/2023

## 2023-03-20 MED ORDER — AMPHETAMINE-DEXTROAMPHETAMINE 30 MG PO TABS: 30.0000 mg | ORAL_TABLET | Freq: Three times a day (TID) | ORAL | 0 refills | Status: DC

## 2023-03-25 DIAGNOSIS — F4322 Adjustment disorder with anxiety: Secondary | ICD-10-CM | POA: Diagnosis not present

## 2023-04-08 DIAGNOSIS — F4322 Adjustment disorder with anxiety: Secondary | ICD-10-CM | POA: Diagnosis not present

## 2023-04-11 DIAGNOSIS — F4322 Adjustment disorder with anxiety: Secondary | ICD-10-CM | POA: Diagnosis not present

## 2023-05-01 ENCOUNTER — Other Ambulatory Visit: Payer: Self-pay | Admitting: Family Medicine

## 2023-05-01 DIAGNOSIS — F909 Attention-deficit hyperactivity disorder, unspecified type: Secondary | ICD-10-CM

## 2023-05-02 ENCOUNTER — Other Ambulatory Visit: Payer: Self-pay | Admitting: Family Medicine

## 2023-05-02 NOTE — Telephone Encounter (Signed)
 Adderall 30mg  - requesting rx rf  Last written 03/20/2023 Last OV 01/162025 Upcoming appt =07/27/2023

## 2023-05-02 NOTE — Telephone Encounter (Unsigned)
 Copied from CRM [id number]. Topic: Clinical - Medication Refill >> May 02, 2023  3:44 PM Maryln Sober wrote: Most Recent Primary Care Visit:  Provider: Adela Holter  Department: [facility]  Visit Type: OFFICE VISIT  Date: 01/27/2023  Medication: amphetamine -dextroamphetamine  (ADDERALL) 30 MG tablet  Has the patient contacted their pharmacy? No (Agent: If no, request that the patient contact the pharmacy for the refill. If patient does not wish to contact the pharmacy document the reason why and proceed with request.) (Agent: If yes, when and what did the pharmacy advise?)  Is this the correct pharmacy for this prescription? Yes If no, delete pharmacy and type the correct one.  This is the patient's preferred pharmacy:  [facility] [phone] - [place], [place] - [address] [place] [address] Phone: [phone] Fax: [phone]  [facility] - [place], [place] - [address] [place] [address] Phone: [phone] Fax: [phone]   Has the prescription been filled recently? Yes  Is the patient out of the medication? Yes  Has the patient been seen for an appointment in the last year OR does the patient have an upcoming appointment? No  Can we respond through MyChart? Yes  Agent: Please be advised that Rx refills may take up to 3 business days. We ask that you follow-up with your pharmacy.

## 2023-05-04 MED ORDER — AMPHETAMINE-DEXTROAMPHETAMINE 30 MG PO TABS: 30.0000 mg | ORAL_TABLET | Freq: Three times a day (TID) | ORAL | 0 refills | Status: DC

## 2023-06-16 ENCOUNTER — Encounter: Payer: Self-pay | Admitting: Family Medicine

## 2023-06-21 ENCOUNTER — Other Ambulatory Visit: Payer: Self-pay | Admitting: Family Medicine

## 2023-07-20 ENCOUNTER — Other Ambulatory Visit: Payer: Self-pay | Admitting: Medical-Surgical

## 2023-07-21 NOTE — Telephone Encounter (Signed)
 Called left vm to call back and schedule appt with Dr. Alvia

## 2023-07-22 NOTE — Telephone Encounter (Signed)
 Last OV: 01/27/23 Last fill: 06/21/23 @ 30 caps  Pls re-attempt to contact pt for Adderall appt with Dr. Alvia. Last OV rescheduled due to PCP out-of-office.

## 2023-07-22 NOTE — Telephone Encounter (Signed)
 2nd attempt trying to reach patient left a vm to call back and schedule an appointment

## 2023-07-27 ENCOUNTER — Ambulatory Visit: Payer: BC Managed Care – PPO | Admitting: Family Medicine

## 2023-07-28 ENCOUNTER — Other Ambulatory Visit: Payer: Self-pay | Admitting: Family Medicine

## 2023-07-28 DIAGNOSIS — E038 Other specified hypothyroidism: Secondary | ICD-10-CM

## 2023-08-17 ENCOUNTER — Encounter: Payer: Self-pay | Admitting: Family Medicine

## 2023-08-17 DIAGNOSIS — F909 Attention-deficit hyperactivity disorder, unspecified type: Secondary | ICD-10-CM

## 2023-08-17 MED ORDER — AMPHETAMINE-DEXTROAMPHETAMINE 30 MG PO TABS: 30.0000 mg | ORAL_TABLET | Freq: Three times a day (TID) | ORAL | 0 refills | Status: DC

## 2023-08-27 ENCOUNTER — Other Ambulatory Visit: Payer: Self-pay | Admitting: Family Medicine

## 2023-09-16 DIAGNOSIS — F4322 Adjustment disorder with anxiety: Secondary | ICD-10-CM | POA: Diagnosis not present

## 2023-09-29 ENCOUNTER — Encounter: Payer: Self-pay | Admitting: Family Medicine

## 2023-09-29 ENCOUNTER — Ambulatory Visit: Admitting: Family Medicine

## 2023-09-29 VITALS — BP 117/75 | HR 89 | Ht 72.0 in | Wt 215.0 lb

## 2023-09-29 DIAGNOSIS — F909 Attention-deficit hyperactivity disorder, unspecified type: Secondary | ICD-10-CM | POA: Diagnosis not present

## 2023-09-29 DIAGNOSIS — Z23 Encounter for immunization: Secondary | ICD-10-CM | POA: Diagnosis not present

## 2023-09-29 DIAGNOSIS — Z1211 Encounter for screening for malignant neoplasm of colon: Secondary | ICD-10-CM | POA: Insufficient documentation

## 2023-09-29 DIAGNOSIS — F411 Generalized anxiety disorder: Secondary | ICD-10-CM

## 2023-09-29 DIAGNOSIS — E038 Other specified hypothyroidism: Secondary | ICD-10-CM

## 2023-09-29 MED ORDER — FLUOXETINE HCL 20 MG PO CAPS: 20.0000 mg | ORAL_CAPSULE | Freq: Every day | ORAL | 1 refills | Status: AC

## 2023-09-29 MED ORDER — TADALAFIL 20 MG PO TABS: 10.0000 mg | ORAL_TABLET | ORAL | 11 refills | Status: AC | PRN

## 2023-09-29 MED ORDER — AMPHETAMINE-DEXTROAMPHETAMINE 30 MG PO TABS: 30.0000 mg | ORAL_TABLET | Freq: Three times a day (TID) | ORAL | 0 refills | Status: DC

## 2023-09-29 MED ORDER — AMPHETAMINE-DEXTROAMPHETAMINE 30 MG PO TABS: 30.0000 mg | ORAL_TABLET | Freq: Three times a day (TID) | ORAL | 0 refills | Status: AC

## 2023-09-29 NOTE — Assessment & Plan Note (Signed)
 Doing well with fluoxetine .  Will plan to continue at current strength.

## 2023-09-29 NOTE — Assessment & Plan Note (Signed)
 Referral placed to gastroenterology.

## 2023-09-29 NOTE — Assessment & Plan Note (Signed)
Doing well with levothyroxine at current strength.  Will plan to continue.

## 2023-09-29 NOTE — Assessment & Plan Note (Signed)
He is doing well with Adderall at current strength.  Will plan to continue with follow-up in 6 months.

## 2023-09-29 NOTE — Progress Notes (Signed)
 Wayne Newman - 48 y.o. male MRN [id number]  Date of birth: May 14, 1975  Subjective Chief Complaint  Patient presents with   Medication Refill    HPI Wayne Newman is a 48 y.o. male here today for follow up visit.   He reports that he is doing well.   He is feeling pretty good with current strength of adderall.  He hasn't had any noticeable side effects at current strength and schedule.    Doing well with synthroid  @ 230mcg/day.  He is taking daily.  Still deals with chronic fatigue but work up for other causes has been unremarkable.   Mood is stable with fluoxetine  at current strength.    ROS:  A comprehensive ROS was completed and negative except as noted per HPI  Allergies  Allergen Reactions   Elemental Sulfur Rash and Other (See Comments)    Had a sulfur abx for pink eye and made eyes more irritated and red    Sulfa Antibiotics Other (See Comments)    Given for pink eye & irritated eye more.    Past Medical History:  Diagnosis Date   Thyroid  disease     No past surgical history on file.  Social History   Socioeconomic History   Marital status: Married    Spouse name: Not on file   Number of children: Not on file   Years of education: Not on file   Highest education level: Not on file  Occupational History   Not on file  Tobacco Use   Smoking status: Never   Smokeless tobacco: Never  Substance and Sexual Activity   Alcohol use: Yes   Drug use: No   Sexual activity: Yes    Birth control/protection: Condom  Other Topics Concern   Not on file  Social History Narrative   Not on file   Social Drivers of Health   Financial Resource Strain: Low Risk  (09/29/2023)   Overall Financial Resource Strain (CARDIA)    Difficulty of Paying Living Expenses: Not hard at all  Food Insecurity: No Food Insecurity (09/29/2023)   Hunger Vital Sign    Worried About Running Out of Food in the Last Year: Never true    Ran Out of Food in the Last Year: Never true   Transportation Needs: No Transportation Needs (09/29/2023)   PRAPARE - Administrator, Civil Service (Medical): No    Lack of Transportation (Non-Medical): No  Physical Activity: Sufficiently Active (09/29/2023)   Exercise Vital Sign    Days of Exercise per Week: 5 days    Minutes of Exercise per Session: 110 min  Stress: No Stress Concern Present (09/29/2023)   [employer] of Occupational Health - Occupational Stress Questionnaire    Feeling of Stress: Not at all  Social Connections: Moderately Isolated (09/29/2023)   Social Connection and Isolation Panel    Frequency of Communication with Friends and Family: Three times a week    Frequency of Social Gatherings with Friends and Family: Once a week    Attends Religious Services: Never    Database administrator or Organizations: No    Attends Engineer, structural: Never    Marital Status: Married    Family History  Problem Relation Age of Onset   Alcohol abuse Unknown        grandfather   Heart attack Unknown        grandfather   Stroke Unknown        grandfather  Health Maintenance  Topic Date Due   Hepatitis B Vaccines 19-59 Average Risk (1 of 3 - 19+ 3-dose series) Never done   Colonoscopy  Never done   Influenza Vaccine  08/12/2023   COVID-19 Vaccine (4 - 2025-26 season) 09/12/2023   DTaP/Tdap/Td (4 - Td or Tdap) 08/28/2026   Hepatitis C Screening  Completed   HIV Screening  Completed   Pneumococcal Vaccine  Aged Out   HPV VACCINES  Aged Out   Meningococcal B Vaccine  Aged Out     ----------------------------------------------------------------------------------------------------------------------------------------------------------------------------------------------------------------- Physical Exam BP 117/75 (BP Location: Left Arm, Patient Position: Sitting, Cuff Size: Normal)   Pulse 89   Ht 6' (1.829 m)   Wt 215 lb (97.5 kg)   SpO2 98%   BMI 29.16 kg/m   Physical  Exam Constitutional:      Appearance: Normal appearance.  Eyes:     General: No scleral icterus. Cardiovascular:     Rate and Rhythm: Normal rate and regular rhythm.  Pulmonary:     Effort: Pulmonary effort is normal.     Breath sounds: Normal breath sounds.  Neurological:     General: No focal deficit present.     Mental Status: He is alert.  Psychiatric:        Mood and Affect: Mood normal.        Behavior: Behavior normal.     ------------------------------------------------------------------------------------------------------------------------------------------------------------------------------------------------------------------- Assessment and Plan  Hypothyroid Doing well with levothyroxine  at current strength.  Will plan to continue.   Colon cancer screening Referral placed to gastroenterology.   ADD (attention deficit disorder) He is doing well with Adderall at current strength.  Will plan to continue with follow-up in 6 months.  Anxiety state Doing well with fluoxetine .  Will plan to continue at current strength.    Meds ordered this encounter  Medications   amphetamine -dextroamphetamine  (ADDERALL) 30 MG tablet    Sig: Take 1 tablet by mouth 3 (three) times daily.    Dispense:  90 tablet    Refill:  0   amphetamine -dextroamphetamine  (ADDERALL) 30 MG tablet    Sig: Take 1 tablet by mouth 3 (three) times daily.    Dispense:  90 tablet    Refill:  0   FLUoxetine  (PROZAC ) 20 MG capsule    Sig: Take 1 capsule (20 mg total) by mouth daily.    Dispense:  90 capsule    Refill:  1   tadalafil  (CIALIS ) 20 MG tablet    Sig: Take 0.5-1 tablets (10-20 mg total) by mouth every other day as needed for erectile dysfunction.    Dispense:  10 tablet    Refill:  11    Return in about 6 months (around 03/28/2024) for ADHD.

## 2023-09-30 DIAGNOSIS — F4322 Adjustment disorder with anxiety: Secondary | ICD-10-CM | POA: Diagnosis not present

## 2023-11-09 ENCOUNTER — Encounter: Payer: Self-pay | Admitting: Family Medicine

## 2023-12-02 DIAGNOSIS — F4322 Adjustment disorder with anxiety: Secondary | ICD-10-CM | POA: Diagnosis not present

## 2023-12-16 DIAGNOSIS — F4322 Adjustment disorder with anxiety: Secondary | ICD-10-CM | POA: Diagnosis not present

## 2023-12-28 DIAGNOSIS — S298XXA Other specified injuries of thorax, initial encounter: Secondary | ICD-10-CM | POA: Diagnosis not present

## 2023-12-28 DIAGNOSIS — R0789 Other chest pain: Secondary | ICD-10-CM | POA: Diagnosis not present

## 2023-12-28 DIAGNOSIS — R0781 Pleurodynia: Secondary | ICD-10-CM | POA: Diagnosis not present

## 2023-12-30 DIAGNOSIS — F4322 Adjustment disorder with anxiety: Secondary | ICD-10-CM | POA: Diagnosis not present

## 2024-01-02 ENCOUNTER — Encounter: Payer: Self-pay | Admitting: Family Medicine

## 2024-01-03 MED ORDER — AMPHETAMINE-DEXTROAMPHETAMINE 30 MG PO TABS: 30.0000 mg | ORAL_TABLET | Freq: Three times a day (TID) | ORAL | 0 refills | Status: DC

## 2024-02-14 ENCOUNTER — Encounter: Payer: Self-pay | Admitting: Family Medicine

## 2024-02-16 MED ORDER — AMPHETAMINE-DEXTROAMPHETAMINE 30 MG PO TABS: 30.0000 mg | ORAL_TABLET | Freq: Three times a day (TID) | ORAL | 0 refills | Status: AC

## 2024-03-28 ENCOUNTER — Ambulatory Visit: Admitting: Family Medicine
